# Patient Record
Sex: Male | Born: 1980 | Race: Black or African American | Hispanic: No | Marital: Single | Smoking: Never smoker
Health system: Southern US, Community
[De-identification: ages and names within clinical notes are randomized; demographics above are authoritative.]

---

## 2012-05-22 ENCOUNTER — Emergency Department (HOSPITAL_COMMUNITY)
Admission: EM | Admit: 2012-05-22 | Discharge: 2012-05-22 | Disposition: A | Discharge status: Home / Self Care | Payer: Self-pay | Attending: Emergency Medicine | Admitting: Emergency Medicine

## 2012-05-22 ENCOUNTER — Encounter (HOSPITAL_COMMUNITY): Payer: Self-pay | Admitting: *Deleted

## 2012-05-22 ENCOUNTER — Emergency Department (HOSPITAL_COMMUNITY): Payer: Self-pay

## 2012-05-22 DIAGNOSIS — R1110 Vomiting, unspecified: Secondary | ICD-10-CM | POA: Insufficient documentation

## 2012-05-22 DIAGNOSIS — R059 Cough, unspecified: Secondary | ICD-10-CM | POA: Insufficient documentation

## 2012-05-22 DIAGNOSIS — R5381 Other malaise: Secondary | ICD-10-CM | POA: Insufficient documentation

## 2012-05-22 DIAGNOSIS — J029 Acute pharyngitis, unspecified: Secondary | ICD-10-CM | POA: Insufficient documentation

## 2012-05-22 DIAGNOSIS — J111 Influenza due to unidentified influenza virus with other respiratory manifestations: Secondary | ICD-10-CM | POA: Insufficient documentation

## 2012-05-22 DIAGNOSIS — IMO0001 Reserved for inherently not codable concepts without codable children: Secondary | ICD-10-CM | POA: Insufficient documentation

## 2012-05-22 DIAGNOSIS — R05 Cough: Secondary | ICD-10-CM | POA: Insufficient documentation

## 2012-05-22 DIAGNOSIS — R5383 Other fatigue: Secondary | ICD-10-CM | POA: Insufficient documentation

## 2012-05-22 MED ORDER — IBUPROFEN 800 MG PO TABS
800.0000 mg | ORAL_TABLET | Freq: Once | ORAL | Status: AC
  Administered 2012-05-22: 800 mg via ORAL
  Filled 2012-05-22: qty 1

## 2012-05-22 MED ORDER — IBUPROFEN 800 MG PO TABS: 800.0000 mg | ORAL_TABLET | Freq: Four times a day (QID) | ORAL | Status: DC | PRN

## 2012-05-22 NOTE — ED Notes (Signed)
Pt c/o vomiting; body aches; pain; sleeping x 2 days; chills

## 2012-05-22 NOTE — ED Provider Notes (Signed)
History     CSN: [id number]  Arrival date & time 05/22/12  0220   First MD Initiated Contact with Patient 05/22/12 [phone]      No chief complaint on file.   (Consider location/radiation/quality/duration/timing/severity/associated sxs/prior treatment) HPI Comments: Patient with myalgias, fever cough sore throat post tussive vomiting for the past 2 days has taken Ibuprofen X1 and Robitussin and has been sleeping a lot   Did not get Flu immunization this year   The history is provided by the patient.    History reviewed. No pertinent past medical history.  History reviewed. No pertinent past surgical history.  No family history on file.  History  Substance Use Topics  . Smoking status: Never Smoker   . Smokeless tobacco: Not on file  . Alcohol Use: No      Review of Systems  Constitutional: Positive for fever, chills and fatigue.  HENT: Positive for sore throat. Negative for congestion.   Respiratory: Positive for cough. Negative for shortness of breath.   Cardiovascular: Negative for chest pain.  Gastrointestinal: Positive for vomiting.  Musculoskeletal: Positive for myalgias.  Skin: Negative for rash and wound.  Neurological: Positive for weakness. Negative for dizziness and headaches.    Allergies  Review of patient's allergies indicates no known allergies.  Home Medications   Current Outpatient Rx  Name  Route  Sig  Dispense  Refill  . GUAIFENESIN 100 MG/5ML PO SYRP   Oral   Take 200 mg by mouth 3 (three) times daily as needed. For cough         . IBUPROFEN 200 MG PO TABS   Oral   Take 400 mg by mouth every 6 (six) hours as needed. For pain         . ZINC PO   Oral   Take 1 tablet by mouth daily.         . IBUPROFEN 800 MG PO TABS   Oral   Take 1 tablet (800 mg total) by mouth every 6 (six) hours as needed for pain.   30 tablet   0     BP 129/80  Pulse 95  Temp 100.8 F (38.2 C)  Resp 28  SpO2 100%  Physical Exam  Constitutional: He  is oriented to person, place, and time. He appears well-developed and well-nourished. No distress.  HENT:  Head: Normocephalic.  Mouth/Throat: Oropharynx is clear and moist. No oropharyngeal exudate.  Eyes: Pupils are equal, round, and reactive to light.  Neck: Normal range of motion.  Cardiovascular: Normal rate and regular rhythm.   Pulmonary/Chest: Effort normal.  Abdominal: Soft.  Musculoskeletal: Normal range of motion.  Neurological: He is alert and oriented to person, place, and time.  Skin: Skin is warm. No rash noted. No pallor.    ED Course  Procedures (including critical care time)  Labs Reviewed - No data to display Dg Chest 2 View  05/22/2012  *RADIOLOGY REPORT*  Clinical Data: Fever, cough and tachycardia.  CHEST - 2 VIEW  Comparison: None.  Findings: The lungs are well-aerated and appear grossly clear. There is no evidence of focal consolidation, pleural effusion or pneumothorax.  The heart is normal in size; the mediastinal contour is within normal limits.  No acute osseous abnormalities are seen.  IMPRESSION: No acute cardiopulmonary process seen.   Original Report Authenticated By: Tonia Ghent, M.D.      1. Flu syndrome       MDM   Will obtain chest xary due to RR  of 26       Arman Filter, NP 05/22/12 0341  Arman Filter, NP 05/22/12 0341  Arman Filter, NP 05/22/12 0349  Arman Filter, NP 05/22/12 [phone]

## 2012-05-22 NOTE — ED Provider Notes (Signed)
Medical screening examination/treatment/procedure(s) were performed by non-physician practitioner and as supervising physician I was immediately available for consultation/collaboration.  John-Adam Keian Odriscoll, M.D.     John-Adam Treazure Nery, MD 05/22/12 0729 

## 2016-09-28 ENCOUNTER — Encounter (HOSPITAL_COMMUNITY): Payer: Self-pay

## 2016-09-28 ENCOUNTER — Emergency Department (HOSPITAL_COMMUNITY): Payer: Managed Care, Other (non HMO)

## 2016-09-28 ENCOUNTER — Emergency Department (HOSPITAL_COMMUNITY)
Admission: EM | Admit: 2016-09-28 | Discharge: 2016-09-29 | Disposition: A | Discharge status: Home / Self Care | Payer: Managed Care, Other (non HMO) | Attending: Emergency Medicine | Admitting: Emergency Medicine

## 2016-09-28 DIAGNOSIS — M62838 Other muscle spasm: Secondary | ICD-10-CM | POA: Diagnosis not present

## 2016-09-28 DIAGNOSIS — M25511 Pain in right shoulder: Secondary | ICD-10-CM

## 2016-09-28 NOTE — ED Triage Notes (Signed)
Pt complaining of R shoulder pain. Pt denies any injury/trauma. Pt states increased pain with motion. Pt with decreased ROM due to pain. Pt denies any numbness/tingling.

## 2016-09-29 MED ORDER — CYCLOBENZAPRINE HCL 10 MG PO TABS: 10.0000 mg | ORAL_TABLET | Freq: Two times a day (BID) | ORAL | 0 refills | Status: DC | PRN

## 2016-09-29 MED ORDER — DICLOFENAC SODIUM 50 MG PO TBEC: 50.0000 mg | DELAYED_RELEASE_TABLET | Freq: Two times a day (BID) | ORAL | 0 refills | Status: DC

## 2016-09-29 NOTE — ED Provider Notes (Signed)
[facility] Provider Note   CSN: [id number] Arrival date & time: 09/28/16  2200     History   Chief Complaint Chief Complaint  Patient presents with  . Shoulder Pain    HPI Edward Montes is a 36 y.o. male who presents to the ED with right shoulder pain that started 2 days ago when he woke. He does not remember any injury. He works at [employer] and does a lot of lifting.  . The history is provided by the patient. No language interpreter was used.  Shoulder Pain   This is a new problem. The current episode started 2 days ago. The problem occurs constantly. The problem has not changed since onset.The pain is present in the right shoulder. The quality of the pain is described as aching. The pain is moderate. Pertinent negatives include no numbness and no tingling. The symptoms are aggravated by activity. He has tried OTC pain medications for the symptoms. The treatment provided no relief. There has been no history of extremity trauma.    History reviewed. No pertinent past medical history.  There are no active problems to display for this patient.   History reviewed. No pertinent surgical history.     Home Medications    Prior to Admission medications   Medication Sig Start Date End Date Taking? Authorizing Provider  cyclobenzaprine (FLEXERIL) 10 MG tablet Take 1 tablet (10 mg total) by mouth 2 (two) times daily as needed for muscle spasms. 09/29/16   Janne NapoleonNeese, Aariyana Manz M, NP  diclofenac (VOLTAREN) 50 MG EC tablet Take 1 tablet (50 mg total) by mouth 2 (two) times daily. 09/29/16   Janne NapoleonNeese, Aran Menning M, NP  guaifenesin (ROBITUSSIN) 100 MG/5ML syrup Take 200 mg by mouth 3 (three) times daily as needed. For cough    [provider]  Multiple Vitamins-Minerals (ZINC PO) Take 1 tablet by mouth daily.    [provider]    Family History History reviewed. No pertinent family history.  Social History Social History  Substance Use Topics  . Smoking status: Never  Smoker  . Smokeless tobacco: Never Used  . Alcohol use No     Allergies   Patient has no known allergies.   Review of Systems Review of Systems  Constitutional: Negative for chills and fever.  HENT: Negative.   Respiratory: Negative for chest tightness.   Cardiovascular: Negative for chest pain.  Gastrointestinal: Negative for abdominal pain, nausea and vomiting.  Musculoskeletal: Positive for arthralgias. Negative for gait problem and neck pain.       Right shoulder pain  Skin: Negative for rash.  Neurological: Negative for tingling and numbness.  Psychiatric/Behavioral: The patient is not nervous/anxious.      Physical Exam Updated Vital Signs BP 127/88 (BP Location: Left Arm)   Pulse 68   Temp 98.5 F (36.9 C) (Oral)   Resp 14   SpO2 100%   Physical Exam  Constitutional: He is oriented to person, place, and time. He appears well-developed and well-nourished. No distress.  HENT:  Head: Normocephalic and atraumatic.  Eyes: EOM are normal.  Neck: Normal range of motion. Neck supple.  Cardiovascular: Normal rate and regular rhythm.   Pulmonary/Chest: Effort normal and breath sounds normal.  Musculoskeletal:       Right shoulder: He exhibits tenderness and spasm. He exhibits no crepitus, no deformity, no laceration, normal pulse and normal strength. Decreased range of motion: due to pain.  Pain to the posterior aspect of the right shoulder with  range of motion and palpation. Full range of motion of the wrist and elbow without pain.  Radial pulses 2+, adequate circulation, equal grips.   Neurological: He is alert and oriented to person, place, and time. No cranial nerve deficit.  Skin: Skin is warm and dry.  Psychiatric: He has a normal mood and affect.  Nursing note and vitals reviewed.    ED Treatments / Results  Labs (all labs ordered are listed, but only abnormal results are displayed) Labs Reviewed - No data to display  Radiology Dg Shoulder Right  Result  Date: 09/28/2016 CLINICAL DATA:  Right shoulder pain for 2 days EXAM: RIGHT SHOULDER - 2+ VIEW COMPARISON:  None. FINDINGS: No acute bony abnormality. Specifically, no fracture, subluxation, or dislocation. Soft tissues are intact. IMPRESSION: Negative. Electronically Signed   By: Charlett Nose M.D.   On: 09/28/2016 22:41    Procedures Procedures (including critical care time)  Medications Ordered in ED Medications - No data to display   Initial Impression / Assessment and Plan / ED Course  I have reviewed the triage vital signs and the nursing notes.  Pertinent imaging results that were available during my care of the patient were reviewed by me and considered in my medical decision making (see chart for details).   Final Clinical Impressions(s) / ED Diagnoses  36 y.o. male with right shoulder pain stable for d/c without focal neuro deficits and no fracture or dislocation noted on x-rays. Will treat for pain and muscle spasm and patient to f/u with ortho.  Final diagnoses:  Acute pain of right shoulder  Muscle spasm    New Prescriptions New Prescriptions   CYCLOBENZAPRINE (FLEXERIL) 10 MG TABLET    Take 1 tablet (10 mg total) by mouth 2 (two) times daily as needed for muscle spasms.   DICLOFENAC (VOLTAREN) 50 MG EC TABLET    Take 1 tablet (50 mg total) by mouth 2 (two) times daily.     Kerrie Buffalo Lowden, NP 09/29/16 4098    Derwood Kaplan, MD 09/29/16 1753

## 2016-09-29 NOTE — Discharge Instructions (Signed)
Do not drive while taking the muscle relaxant as it will  make you sleepy. Follow up with Dr. Charlann Boxerlin for further evaluation of your shoulder pain.

## 2018-01-05 ENCOUNTER — Encounter (HOSPITAL_COMMUNITY): Payer: Self-pay | Admitting: Emergency Medicine

## 2018-01-05 ENCOUNTER — Other Ambulatory Visit: Payer: Self-pay

## 2018-01-05 ENCOUNTER — Ambulatory Visit (HOSPITAL_COMMUNITY)
Admission: EM | Admit: 2018-01-05 | Discharge: 2018-01-05 | Disposition: A | Discharge status: Home / Self Care | Payer: Managed Care, Other (non HMO) | Attending: Family Medicine | Admitting: Family Medicine

## 2018-01-05 DIAGNOSIS — T148XXA Other injury of unspecified body region, initial encounter: Secondary | ICD-10-CM

## 2018-01-05 MED ORDER — IBUPROFEN 800 MG PO TABS: 800.0000 mg | ORAL_TABLET | Freq: Three times a day (TID) | ORAL | 0 refills | Status: DC | PRN

## 2018-01-05 MED ORDER — CYCLOBENZAPRINE HCL 10 MG PO TABS: 10.0000 mg | ORAL_TABLET | Freq: Two times a day (BID) | ORAL | 0 refills | Status: DC | PRN

## 2018-01-05 NOTE — ED Provider Notes (Addendum)
[facility]    CSN: [id number] Arrival date & time: 01/05/18  1841     History   Chief Complaint Chief Complaint  Patient presents with  . Back Pain    HPI Edward Montes is a 37 y.o. male.   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  He does lifting all day long.  Is been working there about 4 weeks.  In the last couple of weeks he has had a lot of 12-hour shifts.  He worked a 12-hour shift yesterday.  He woke up the morning with pain around his right shoulder blade.  It hurts with deep breath.  Hurts with arm movement.  No trauma or injury.  He was doing his usual work activities.  Ever had pain in his thoracic spine previously.  She has had problems with that left shoulder previously and sometimes has trouble lifting it overhead.  This is not kept him from doing his full duty work. History reviewed. No pertinent past medical history.  There are no active problems to display for this patient.   History reviewed. No pertinent surgical history.     Home Medications    Prior to Admission medications   Medication Sig Start Date End Date Taking? Authorizing Provider  cyclobenzaprine (FLEXERIL) 10 MG tablet Take 1 tablet (10 mg total) by mouth 2 (two) times daily as needed for muscle spasms. 01/05/18   Eustace Moore, MD  ibuprofen (ADVIL,MOTRIN) 800 MG tablet Take 1 tablet (800 mg total) by mouth every 8 (eight) hours as needed for moderate pain. 01/05/18   Eustace Moore, MD    Family History Family History  Problem Relation Age of Onset  . Healthy Mother     Social History Social History   Tobacco Use  . Smoking status: Never Smoker  . Smokeless tobacco: Never Used  Substance Use Topics  . Alcohol use: No  . Drug use: No     Allergies   Patient has no known allergies.   Review of Systems Review of Systems  Constitutional: Negative for chills and fever.  HENT: Negative for ear pain and sore throat.   Eyes:  Negative for pain and visual disturbance.  Respiratory: Negative for cough and shortness of breath.   Cardiovascular: Negative for chest pain and palpitations.  Gastrointestinal: Negative for abdominal pain and vomiting.  Genitourinary: Negative for dysuria and hematuria.  Musculoskeletal: Positive for back pain. Negative for arthralgias.       Thoracic  Skin: Negative for color change and rash.  Neurological: Negative for seizures and syncope.  All other systems reviewed and are negative.    Physical Exam Triage Vital Signs ED Triage Vitals  Enc Vitals Group     BP 01/05/18 1945 104/75     Pulse Rate 01/05/18 1945 84     Resp --      Temp 01/05/18 1945 98.1 F (36.7 C)     Temp Source 01/05/18 1945 Oral     SpO2 01/05/18 1945 100 %     Weight --      Height --      Head Circumference --      Peak Flow --      Pain Score 01/05/18 1941 7     Pain Loc --      Pain Edu? --      Excl. in GC? --    No data found.  Updated Vital Signs BP 104/75 (BP  Location: Right Arm)   Pulse 84   Temp 98.1 F (36.7 C) (Oral)   SpO2 100%   Visual Acuity Right Eye Distance:   Left Eye Distance:   Bilateral Distance:    Right Eye Near:   Left Eye Near:    Bilateral Near:     Physical Exam  Constitutional: He appears well-developed and well-nourished. No distress.  HENT:  Head: Normocephalic and atraumatic.  Mouth/Throat: Oropharynx is clear and moist.  Eyes: Pupils are equal, round, and reactive to light. Conjunctivae are normal.  Neck: Normal range of motion.  Cardiovascular: Normal rate, regular rhythm and normal heart sounds.  Pulmonary/Chest: Effort normal and breath sounds normal. No respiratory distress.  Abdominal: Soft. He exhibits no distension.  Musculoskeletal: Normal range of motion. He exhibits no edema.       Cervical back: He exhibits tenderness.       Back:  Neurological: He is alert.  Skin: Skin is warm and dry.     UC Treatments / Results  Labs (all  labs ordered are listed, but only abnormal results are displayed) Labs Reviewed - No data to display  EKG None  Radiology No results found.  Procedures Procedures (including critical care time)  Medications Ordered in UC Medications - No data to display  Initial Impression / Assessment and Plan / UC Course  I have reviewed the triage vital signs and the nursing notes.  Pertinent labs & imaging results that were available during my care of the patient were reviewed by me and considered in my medical decision making (see chart for details).      Final Clinical Impressions(s) / UC Diagnoses   Final diagnoses:  Muscle strain     Discharge Instructions     Rest Ice to area Take the ibuprofen 3 x a day Take the cyclobenzaprine as needed - muscle relaxer Follow up with ortho if not better by end of week   ED Prescriptions    Medication Sig Dispense Auth. Provider   cyclobenzaprine (FLEXERIL) 10 MG tablet Take 1 tablet (10 mg total) by mouth 2 (two) times daily as needed for muscle spasms. 30 tablet Eustace Moore, MD   ibuprofen (ADVIL,MOTRIN) 800 MG tablet Take 1 tablet (800 mg total) by mouth every 8 (eight) hours as needed for moderate pain. 90 tablet Eustace Moore, MD     Controlled Substance Prescriptions [place] Controlled Substance Registry consulted? Not Applicable   Eustace Moore, MD 01/05/18 2116    Eustace Moore, MD 01/05/18 2117

## 2018-01-05 NOTE — ED Triage Notes (Signed)
Left shoulder blade and left shoulder pain, spasms.  Onset of pain this morning.  No known injury.  Patient has been working a new job at a distribution center.  Patient is right handed.

## 2018-01-05 NOTE — Discharge Instructions (Signed)
Rest Ice to area Take the ibuprofen 3 x a day Take the cyclobenzaprine as needed - muscle relaxer Follow up with ortho if not better by end of week

## 2018-02-15 ENCOUNTER — Ambulatory Visit (HOSPITAL_COMMUNITY)
Admission: EM | Admit: 2018-02-15 | Discharge: 2018-02-15 | Disposition: A | Discharge status: Home / Self Care | Payer: Managed Care, Other (non HMO) | Attending: Internal Medicine | Admitting: Internal Medicine

## 2018-02-15 ENCOUNTER — Ambulatory Visit (INDEPENDENT_AMBULATORY_CARE_PROVIDER_SITE_OTHER): Payer: Managed Care, Other (non HMO)

## 2018-02-15 ENCOUNTER — Other Ambulatory Visit: Payer: Self-pay

## 2018-02-15 ENCOUNTER — Encounter (HOSPITAL_COMMUNITY): Payer: Self-pay | Admitting: Emergency Medicine

## 2018-02-15 DIAGNOSIS — X503XXA Overexertion from repetitive movements, initial encounter: Secondary | ICD-10-CM | POA: Diagnosis not present

## 2018-02-15 DIAGNOSIS — S46912A Strain of unspecified muscle, fascia and tendon at shoulder and upper arm level, left arm, initial encounter: Secondary | ICD-10-CM

## 2018-02-15 DIAGNOSIS — R05 Cough: Secondary | ICD-10-CM | POA: Diagnosis not present

## 2018-02-15 MED ORDER — DICLOFENAC POTASSIUM(MIGRAINE) 50 MG PO PACK: PACK | ORAL | 0 refills | Status: DC

## 2018-02-15 NOTE — ED Provider Notes (Signed)
[facility]    CSN: [id number] Arrival date & time: 02/15/18  1650     History   Chief Complaint Chief Complaint  Patient presents with  . Shoulder Pain    HPI Edward Montes is a 37 y.o. male.   Woke up this am at 5 with L shoulder pain. Denies an active injury that her recalls. He does do a lot of repetitive lifting at work. His pain is in the axilla, and lifting more than 90 degrees. Pain is bette with keeping arm down and not move it,but very minimal. Pain level at its worse 7.5/10, at rest 5/10. Denies neck pain or L arm paresthesia. He took Advil 400 mg today which does not feel it helped much. He did have L posterior shoulder pain last month and was seen here. Did not have an xray. Today's  this pain is different than then.      History reviewed. No pertinent past medical history.  There are no active problems to display for this patient.   History reviewed. No pertinent surgical history.     Home Medications    Prior to Admission medications   Medication Sig Start Date End Date Taking? Authorizing Provider  ibuprofen (ADVIL,MOTRIN) 800 MG tablet Take 1 tablet (800 mg total) by mouth every 8 (eight) hours as needed for moderate pain. 01/05/18  Yes Eustace Moore, MD  Diclofenac Potassium 50 MG PACK One tid prn pain 02/15/18   Rodriguez-Southworth, Nettie Elm, PA-C    Family History Family History  Problem Relation Age of Onset  . Healthy Mother     Social History Social History   Tobacco Use  . Smoking status: Never Smoker  . Smokeless tobacco: Never Used  Substance Use Topics  . Alcohol use: No  . Drug use: No     Allergies   Patient has no known allergies.   Review of Systems Review of Systems  Constitutional: Negative for chills, diaphoresis and fever.  HENT: Positive for postnasal drip and rhinorrhea.        Has allergies  Respiratory: Positive for cough. Negative for chest tightness and shortness of breath.        Has cough  from allergies, but is not worse than usual.   Cardiovascular: Negative for chest pain.  Gastrointestinal: Negative for abdominal pain.  Musculoskeletal: Positive for arthralgias. Negative for joint swelling, neck pain and neck stiffness.  Skin: Negative for rash.  Neurological: Negative for weakness and numbness.  Hematological: Negative for adenopathy.     Physical Exam Triage Vital Signs ED Triage Vitals  Enc Vitals Group     BP 02/15/18 1724 110/63     Pulse Rate 02/15/18 1724 60     Resp 02/15/18 1724 16     Temp 02/15/18 1724 98.2 F (36.8 C)     Temp Source 02/15/18 1724 Oral     SpO2 02/15/18 1724 100 %     Weight --      Height --      Head Circumference --      Peak Flow --      Pain Score 02/15/18 1722 7     Pain Loc --      Pain Edu? --      Excl. in GC? --    No data found.  Updated Vital Signs BP 110/63 (BP Location: Left Arm)   Pulse 60   Temp 98.2 F (36.8 C) (Oral)   Resp 16   SpO2 100%  Visual Acuity Right Eye Distance:   Left Eye Distance:   Bilateral Distance:    Right Eye Near:   Left Eye Near:    Bilateral Near:     Physical Exam  Constitutional: He is oriented to person, place, and time. He appears well-developed and well-nourished. No distress.  HENT:  Head: Normocephalic and atraumatic.  Right Ear: External ear normal.  Nose: Nose normal.  Eyes: Conjunctivae are normal. Right eye exhibits no discharge. Left eye exhibits no discharge. No scleral icterus.  Neck: Neck supple.  Cardiovascular: Normal rate and regular rhythm.  Pulmonary/Chest: Effort normal and breath sounds normal.  Musculoskeletal: He exhibits no deformity.  + impingement test. He does not have any pain with local palpation of his L shoulder. Clavicles are symmetric and non tender.  L shoulder strength 5/5, but felt pain on anterior shoulder when this was being tested. Does not have any tenderness on L trapezius or scapula region.   Neurological: He is alert and  oriented to person, place, and time. He displays normal reflexes. No sensory deficit.  Skin: Skin is warm and dry. Capillary refill takes less than 2 seconds. He is not diaphoretic.  Psychiatric: He has a normal mood and affect. His behavior is normal. Judgment and thought content normal.  Nursing note and vitals reviewed.  UC Treatments / Results  Labs (all labs ordered are listed, but only abnormal results are displayed) Labs Reviewed - No data to display  EKG None  Radiology Preliminary independent review of L shoulder xray is neg. Radiology final read is pending.   Procedures   Medications Ordered in UC Medications - No data to display  Initial Impression / Assessment and Plan / UC Course  I have reviewed the triage vital signs and the nursing notes.  Pertinent  imaging results that were available during my care of the patient were reviewed by me and considered in my medical decision making (see chart for details).  Pt was placed on diclofenac as noted and taught to do stretching exercises and walking the wall exercises.  Needs to Fu with ortho. I also gave him FNP information who is taking new pts    Final Clinical Impressions(s) / UC Diagnoses   Final diagnoses:  Strain of left shoulder, initial encounter     Discharge Instructions     1- Do the walking on the wall exercises I showed you to prevent shoulder freezing 2- Ice area of pain for 15-20 minutes at a time 2-4 times a day for 48h. 3- Take the prescription for pain and inflammation I sent to your pharmacy as indicated and if you need more pain relief, you may add Tylenol 500 mg 2 every 6 h.  4- Go see your family Dr for follow up, you may need to be sent to physical therapy to help.     ED Prescriptions    Medication Sig Dispense Auth. Provider   Diclofenac Potassium 50 MG PACK One tid prn pain 30 each Rodriguez-Southworth, Nettie Elm, PA-C     Controlled Substance Prescriptions [place] Controlled Substance  Registry consulted? no   Garey Ham, [place] 02/15/18 1855

## 2018-02-15 NOTE — ED Triage Notes (Signed)
The patient presented to the [facility] with a complaint of left shoulder pain that started today. The patient denied any known injury but stated it could be work related.

## 2018-02-15 NOTE — Discharge Instructions (Signed)
1- Do the walking on the wall exercises I showed you to prevent shoulder freezing 2- Ice area of pain for 15-20 minutes at a time 2-4 times a day for 48h. 3- Take the prescription for pain and inflammation I sent to your pharmacy as indicated and if you need more pain relief, you may add Tylenol 500 mg 2 every 6 h.  4- Go see your family Dr for follow up, you may need to be sent to physical therapy to help.

## 2018-02-24 ENCOUNTER — Ambulatory Visit (INDEPENDENT_AMBULATORY_CARE_PROVIDER_SITE_OTHER): Payer: Managed Care, Other (non HMO)

## 2018-02-24 ENCOUNTER — Ambulatory Visit (INDEPENDENT_AMBULATORY_CARE_PROVIDER_SITE_OTHER): Payer: Managed Care, Other (non HMO) | Admitting: Family Medicine

## 2018-02-24 ENCOUNTER — Encounter: Payer: Self-pay | Admitting: Family Medicine

## 2018-02-24 VITALS — BP 110/62 | HR 55 | Temp 98.4°F

## 2018-02-24 DIAGNOSIS — M25512 Pain in left shoulder: Secondary | ICD-10-CM | POA: Insufficient documentation

## 2018-02-24 MED ORDER — IBUPROFEN-FAMOTIDINE 800-26.6 MG PO TABS: 1.0000 | ORAL_TABLET | Freq: Three times a day (TID) | ORAL | 3 refills | Status: DC

## 2018-02-24 NOTE — Progress Notes (Signed)
Edward Montes - 37 y.o. male MRN [id number]  Date of birth: Nov 26, 1980  SUBJECTIVE:  Including CC & ROS.  Chief Complaint  Patient presents with  . Shoulder Pain    L shoulder pain x2 months, aggrevated due to reaching and lifting. taking OTC for relief    Edward Montes is a 37 y.o. male that is presenting with left shoulder pain.  The pain is localized to the shoulder.  The pain is worse with reaching out or overhead lifting or his arm in certain positions.  The pain is moderate in severity.  He denies any inciting event.  Pain is sharp in nature.  Denies any radicular symptoms.  Denies any sensational changes.  Denies any bruising or swelling.  Pain is located to the lateral aspect of the shoulder.   Review of Systems  Constitutional: Negative for fever.  HENT: Negative for congestion.   Respiratory: Negative for cough.   Cardiovascular: Negative for chest pain.  Gastrointestinal: Negative for abdominal pain.  Musculoskeletal: Negative for back pain.  Skin: Negative for color change.  Neurological: Negative for weakness.  Hematological: Negative for adenopathy.  Psychiatric/Behavioral: Negative for agitation.    HISTORY: Past Medical, Surgical, Social, and Family History Reviewed & Updated per EMR.   Pertinent Historical Findings include:  No past medical history on file.  No past surgical history on file.  No Known Allergies  Family History  Problem Relation Age of Onset  . Healthy Mother      Social History   Socioeconomic History  . Marital status: Single    Spouse name: Not on file  . Number of children: Not on file  . Years of education: Not on file  . Highest education level: Not on file  Occupational History  . Not on file  Social Needs  . Financial resource strain: Not on file  . Food insecurity:    Worry: Not on file    Inability: Not on file  . Transportation needs:    Medical: Not on file    Non-medical: Not on file  Tobacco Use  . Smoking  status: Never Smoker  . Smokeless tobacco: Never Used  Substance and Sexual Activity  . Alcohol use: No  . Drug use: No  . Sexual activity: Not on file  Lifestyle  . Physical activity:    Days per week: Not on file    Minutes per session: Not on file  . Stress: Not on file  Relationships  . Social connections:    Talks on phone: Not on file    Gets together: Not on file    Attends religious service: Not on file    Active member of club or organization: Not on file    Attends meetings of clubs or organizations: Not on file    Relationship status: Not on file  . Intimate partner violence:    Fear of current or ex partner: Not on file    Emotionally abused: Not on file    Physically abused: Not on file    Forced sexual activity: Not on file  Other Topics Concern  . Not on file  Social History Narrative  . Not on file     PHYSICAL EXAM:  VS: BP 110/62 (BP Location: Right Arm, Patient Position: Sitting, Cuff Size: Normal)   Pulse (!) 55   Temp 98.4 F (36.9 C) (Oral)   SpO2 98%  Physical Exam Gen: NAD, alert, cooperative with exam, well-appearing ENT: normal lips, normal nasal  mucosa,  Eye: normal EOM, normal conjunctiva and lids CV:  no edema, +2 pedal pulses   Resp: no accessory muscle use, non-labored,   Skin: no rashes, no areas of induration  Neuro: normal tone, normal sensation to touch Psych:  normal insight, alert and oriented MSK:  Left shoulder: Normal active range of motion. Normal external rotation with abduction. Normal strength resistance with external rotation and internal rotation. Pain with empty can testing. Pain with speeds test. Pain with O'Brien's testing. Normal grip strength. Neurovascular intact  Limited ultrasound: Left shoulder:  Normal-appearing biceps tendon. Normal-appearing subscap and static dynamic testing Supraspinatus with hypoechoic changes to suggest tendinopathy.  Possible spurring at the tip of the acromium but no catching  or impinging upon dynamic testing the supraspinatus AC joint with mild degenerative changes and calcification  Summary: Findings suggestive of supraspinatus tendinopathy.  Ultrasound and interpretation by Clare Gandy, MD      ASSESSMENT & PLAN:   Acute pain of left shoulder Findings suggestive of supraspinatus tendinopathy.  Has repetitive lifting at work that seems to aggravate it.  No specific injury. -Counseled on supportive care and exercise -Duexis. -Provided Thera-Band. -If no improvement consider imaging, x-ray, or injection.

## 2018-02-24 NOTE — Patient Instructions (Signed)
Nice to meet you  Please try the medicine  Please try the exercises  Please see me back in 3 weeks.

## 2018-02-24 NOTE — Assessment & Plan Note (Signed)
Findings suggestive of supraspinatus tendinopathy.  Has repetitive lifting at work that seems to aggravate it.  No specific injury. -Counseled on supportive care and exercise -Duexis. -Provided Thera-Band. -If no improvement consider imaging, x-ray, or injection.

## 2018-03-17 ENCOUNTER — Ambulatory Visit: Payer: Managed Care, Other (non HMO) | Admitting: Family Medicine

## 2018-03-17 DIAGNOSIS — Z0289 Encounter for other administrative examinations: Secondary | ICD-10-CM

## 2018-03-17 NOTE — Progress Notes (Deleted)
  Edward Stabsntoine L Singleterry - 37 y.o. male MRN [id number]  Date of birth: 04-Sep-1980  SUBJECTIVE:  Including CC & ROS.  No chief complaint on file.   Edward Montes is a 37 y.o. male that is  ***.  ***   Review of Systems  HISTORY: Past Medical, Surgical, Social, and Family History Reviewed & Updated per EMR.   Pertinent Historical Findings include:  No past medical history on file.  No past surgical history on file.  No Known Allergies  Family History  Problem Relation Age of Onset  . Healthy Mother      Social History   Socioeconomic History  . Marital status: Single    Spouse name: Not on file  . Number of children: Not on file  . Years of education: Not on file  . Highest education level: Not on file  Occupational History  . Not on file  Social Needs  . Financial resource strain: Not on file  . Food insecurity:    Worry: Not on file    Inability: Not on file  . Transportation needs:    Medical: Not on file    Non-medical: Not on file  Tobacco Use  . Smoking status: Never Smoker  . Smokeless tobacco: Never Used  Substance and Sexual Activity  . Alcohol use: No  . Drug use: No  . Sexual activity: Not on file  Lifestyle  . Physical activity:    Days per week: Not on file    Minutes per session: Not on file  . Stress: Not on file  Relationships  . Social connections:    Talks on phone: Not on file    Gets together: Not on file    Attends religious service: Not on file    Active member of club or organization: Not on file    Attends meetings of clubs or organizations: Not on file    Relationship status: Not on file  . Intimate partner violence:    Fear of current or ex partner: Not on file    Emotionally abused: Not on file    Physically abused: Not on file    Forced sexual activity: Not on file  Other Topics Concern  . Not on file  Social History Narrative  . Not on file     PHYSICAL EXAM:  VS: There were no vitals taken for this visit. Physical  Exam Gen: NAD, alert, cooperative with exam, well-appearing ENT: normal lips, normal nasal mucosa,  Eye: normal EOM, normal conjunctiva and lids CV:  no edema, +2 pedal pulses   Resp: no accessory muscle use, non-labored,  GI: no masses or tenderness, no hernia  Skin: no rashes, no areas of induration  Neuro: normal tone, normal sensation to touch Psych:  normal insight, alert and oriented MSK:  ***      ASSESSMENT & PLAN:   No problem-specific Assessment & Plan notes found for this encounter.   The above documentation has been reviewed and is accurate and complete. Clare Gandy<Zema Lizardo, MD 03/17/2018, 8:19 AM>

## 2018-04-07 ENCOUNTER — Other Ambulatory Visit: Payer: Self-pay

## 2018-04-07 ENCOUNTER — Encounter (HOSPITAL_COMMUNITY): Payer: Self-pay | Admitting: Emergency Medicine

## 2018-04-07 ENCOUNTER — Emergency Department (HOSPITAL_COMMUNITY)
Admission: EM | Admit: 2018-04-07 | Discharge: 2018-04-07 | Disposition: A | Discharge status: Home / Self Care | Payer: Managed Care, Other (non HMO) | Attending: Emergency Medicine | Admitting: Emergency Medicine

## 2018-04-07 DIAGNOSIS — R1110 Vomiting, unspecified: Secondary | ICD-10-CM | POA: Diagnosis present

## 2018-04-07 DIAGNOSIS — R197 Diarrhea, unspecified: Secondary | ICD-10-CM | POA: Diagnosis not present

## 2018-04-07 DIAGNOSIS — R112 Nausea with vomiting, unspecified: Secondary | ICD-10-CM

## 2018-04-07 LAB — COMPREHENSIVE METABOLIC PANEL
ALBUMIN: 4 g/dL (ref 3.5–5.0)
ALT: 22 U/L (ref 0–44)
AST: 21 U/L (ref 15–41)
Alkaline Phosphatase: 104 U/L (ref 38–126)
Anion gap: 9 (ref 5–15)
BUN: 17 mg/dL (ref 6–20)
CHLORIDE: 106 mmol/L (ref 98–111)
CO2: 26 mmol/L (ref 22–32)
Calcium: 9.3 mg/dL (ref 8.9–10.3)
Creatinine, Ser: 1.04 mg/dL (ref 0.61–1.24)
GFR calc Af Amer: >60 mL/min (ref >60)
Glucose, Bld: 101 mg/dL — ABNORMAL HIGH (ref 70–99)
POTASSIUM: 3.6 mmol/L (ref 3.5–5.1)
Sodium: 141 mmol/L (ref 135–145)
Total Bilirubin: 0.4 mg/dL (ref 0.3–1.2)
Total Protein: 7.2 g/dL (ref 6.5–8.1)

## 2018-04-07 LAB — CBC
HEMATOCRIT: 45 % (ref 39.0–52.0)
HEMOGLOBIN: 14.1 g/dL (ref 13.0–17.0)
MCH: 26.7 pg (ref 26.0–34.0)
MCHC: 31.3 g/dL (ref 30.0–36.0)
MCV: 85.2 fL (ref 80.0–100.0)
NRBC: 0 % (ref 0.0–0.2)
Platelets: 304 10*3/uL (ref 150–400)
RBC: 5.28 MIL/uL (ref 4.22–5.81)
RDW: 13.1 % (ref 11.5–15.5)
WBC: 6.7 10*3/uL (ref 4.0–10.5)

## 2018-04-07 LAB — LIPASE, BLOOD: LIPASE: 31 U/L (ref 11–51)

## 2018-04-07 MED ORDER — ONDANSETRON 4 MG PO TBDP
8.0000 mg | ORAL_TABLET | Freq: Once | ORAL | Status: AC
  Administered 2018-04-07: 8 mg via ORAL
  Filled 2018-04-07: qty 2

## 2018-04-07 NOTE — ED Notes (Signed)
Patient given PO fluids. Tolerating well at this time.  

## 2018-04-07 NOTE — Discharge Instructions (Signed)

## 2018-04-07 NOTE — ED Triage Notes (Signed)
Pt reports generalized body aches, N/V/D that started this morning. Denies fevers.

## 2018-04-07 NOTE — ED Provider Notes (Signed)
[facility] EMERGENCY DEPARTMENT Provider Note   CSN: [id number] Arrival date & time: 04/07/18  0002     History   Chief Complaint Chief Complaint  Patient presents with  . Emesis  . Diarrhea  . Generalized Body Aches    HPI DEVAUN HERNANDEZ is a 37 y.o. male.  The history is provided by the patient.  Emesis   This is a new problem. The current episode started 12 to 24 hours ago. The problem has been gradually worsening. The emesis has an appearance of stomach contents. There has been no fever. Associated symptoms include chills, cough, diarrhea and myalgias. Pertinent negatives include no abdominal pain and no fever.  Diarrhea   Associated symptoms include vomiting, chills, myalgias and cough. Pertinent negatives include no abdominal pain.    Patient reports onset of vomiting/diarrhea the past 4 hours.  Stool and vomit are both nonbloody.  No fevers.  No travel.  He is otherwise healthy.  No significant abdominal pain  Patient Active Problem List   Diagnosis Date Noted  . Acute pain of left shoulder 02/24/2018    History reviewed. No pertinent surgical history.      Home Medications    Prior to Admission medications   Medication Sig Start Date End Date Taking? Authorizing Provider  Diclofenac Potassium 50 MG PACK One tid prn pain Patient not taking: Reported on 02/24/2018 02/15/18   Rodriguez-Southworth, Nettie Elm, PA-C  ibuprofen (ADVIL,MOTRIN) 800 MG tablet Take 1 tablet (800 mg total) by mouth every 8 (eight) hours as needed for moderate pain. 01/05/18   Eustace Moore, MD  Ibuprofen-Famotidine 800-26.6 MG TABS Take 1 tablet by mouth 3 (three) times daily. 02/24/18   Myra Rude, MD    Family History Family History  Problem Relation Age of Onset  . Healthy Mother     Social History Social History   Tobacco Use  . Smoking status: Never Smoker  . Smokeless tobacco: Never Used  Substance Use Topics  . Alcohol use: No  . Drug use:  No     Allergies   Patient has no known allergies.   Review of Systems Review of Systems  Constitutional: Positive for chills. Negative for fever.  Respiratory: Positive for cough.   Gastrointestinal: Positive for diarrhea and vomiting. Negative for abdominal pain.  Musculoskeletal: Positive for myalgias.  All other systems reviewed and are negative.    Physical Exam Updated Vital Signs BP 113/82 (BP Location: Right Arm)   Pulse 65   Temp 97.7 F (36.5 C) (Oral)   Resp 18   Ht 1.88 m (6\' 2" )   Wt 77.1 kg   SpO2 98%   BMI 21.83 kg/m   Physical Exam CONSTITUTIONAL: Well developed/well nourished HEAD: Normocephalic/atraumatic EYES: EOMI/PERRL, no icterus ENMT: Mucous membranes moist NECK: supple no meningeal signs SPINE/BACK:entire spine nontender CV: S1/S2 noted, no murmurs/rubs/gallops noted LUNGS: Lungs are clear to auscultation bilaterally, no apparent distress ABDOMEN: soft, nontender, no rebound or guarding, bowel sounds noted throughout abdomen NEURO: Pt is awake/alert/appropriate, moves all extremitiesx4.  No facial droop.   EXTREMITIES: pulses normal/equal, full ROM SKIN: warm, color normal PSYCH: no abnormalities of mood noted, alert and oriented to situation   ED Treatments / Results  Labs (all labs ordered are listed, but only abnormal results are displayed) Labs Reviewed  COMPREHENSIVE METABOLIC PANEL - Abnormal; Notable for the following components:      Result Value   Glucose, Bld 101 (*)    All other components  within normal limits  LIPASE, BLOOD  CBC    EKG None  Radiology No results found.  Procedures Procedures (including critical care time)  Medications Ordered in ED Medications  ondansetron (ZOFRAN-ODT) disintegrating tablet 8 mg (8 mg Oral Given 04/07/18 0412)     Initial Impression / Assessment and Plan / ED Course  I have reviewed the triage vital signs and the nursing notes.  Pertinent labs   results that were  available during my care of the patient were reviewed by me and considered in my medical decision making (see chart for details).     Patient with vomiting and diarrhea.  Vitals are appropriate.  He has no abdominal pain.  Labs reassuring Suspect viral illness.  He is appropriate for discharge home  Final Clinical Impressions(s) / ED Diagnoses   Final diagnoses:  Nausea vomiting and diarrhea    ED Discharge Orders    None       Zadie RhineWickline, Skyelynn Rambeau, MD 04/07/18 [id number]

## 2018-04-07 NOTE — ED Notes (Signed)
Patient verbalizes understanding of medications and discharge instructions. No further questions at this time. VSS and patient ambulatory at discharge.   

## 2018-09-08 IMAGING — DX DG SHOULDER 2+V*R*
2 series · 2 of 2 positions shown · non-contrast
Comparison: None.

CLINICAL DATA: Right shoulder pain for 2 days

EXAM:
RIGHT SHOULDER - 2+ VIEW

[shoulder grashey]
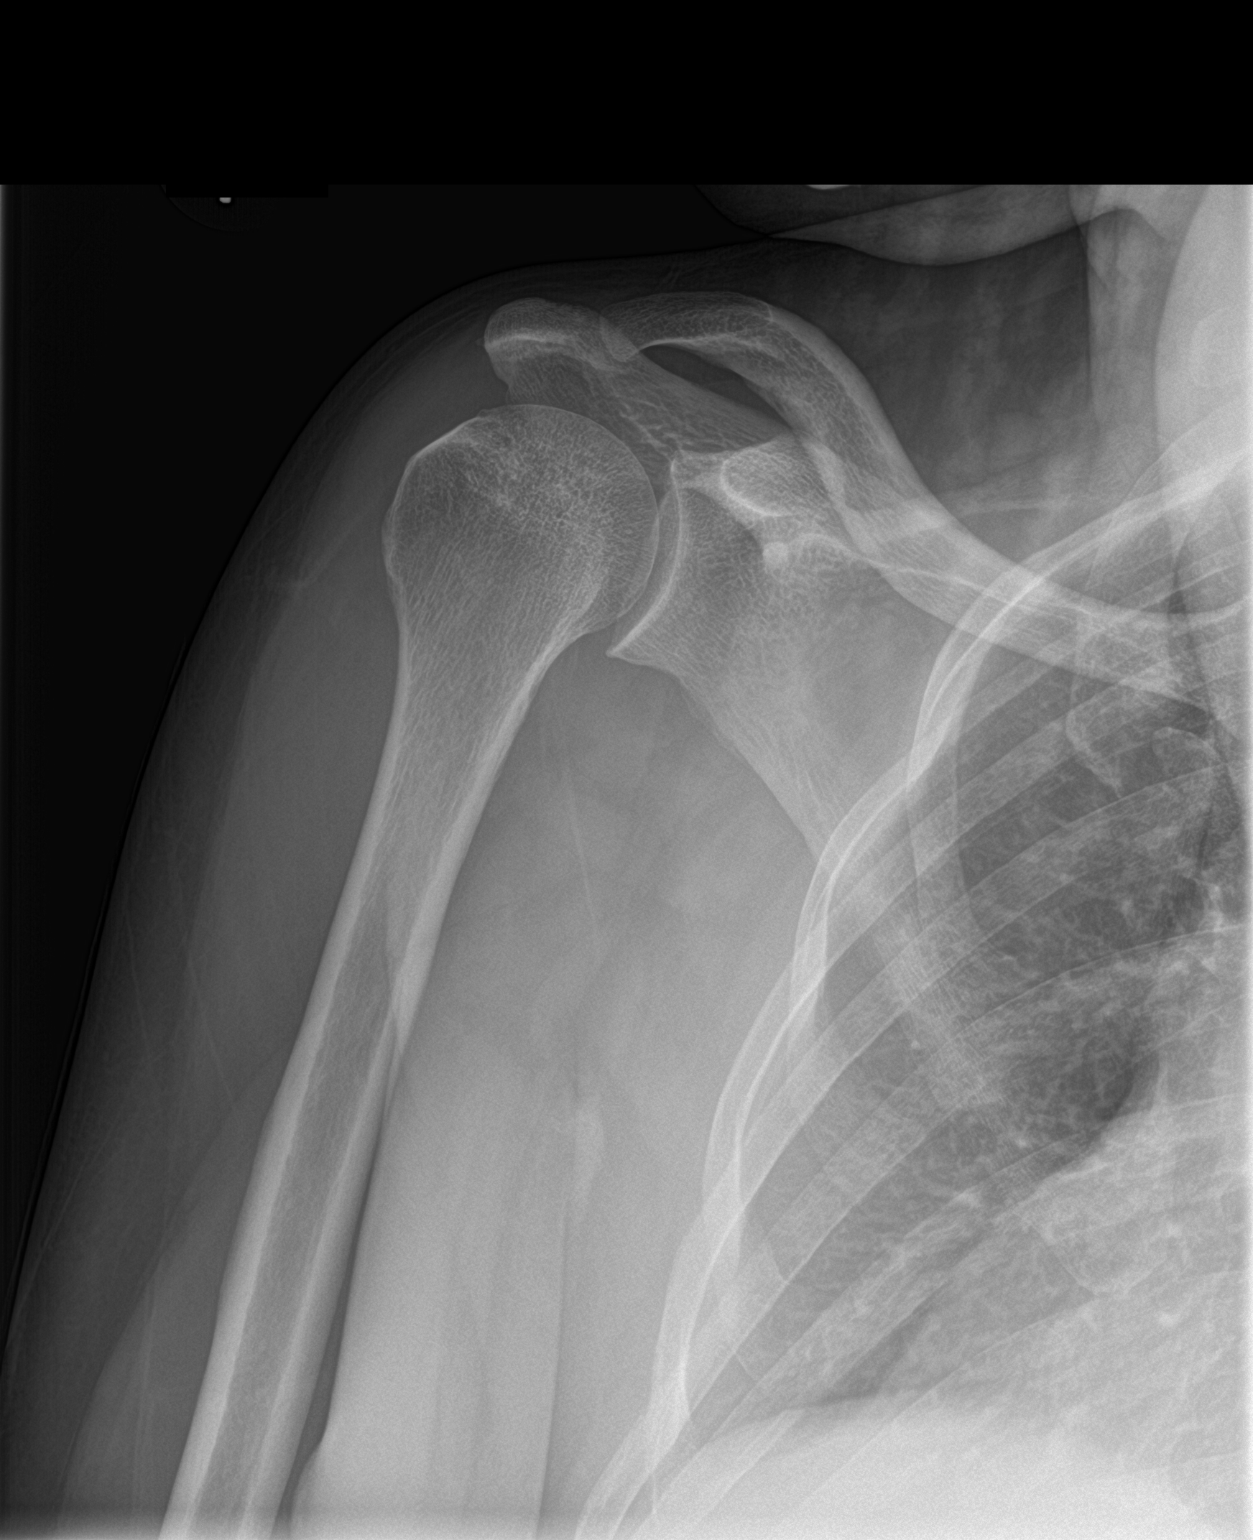

[shoulder y view]
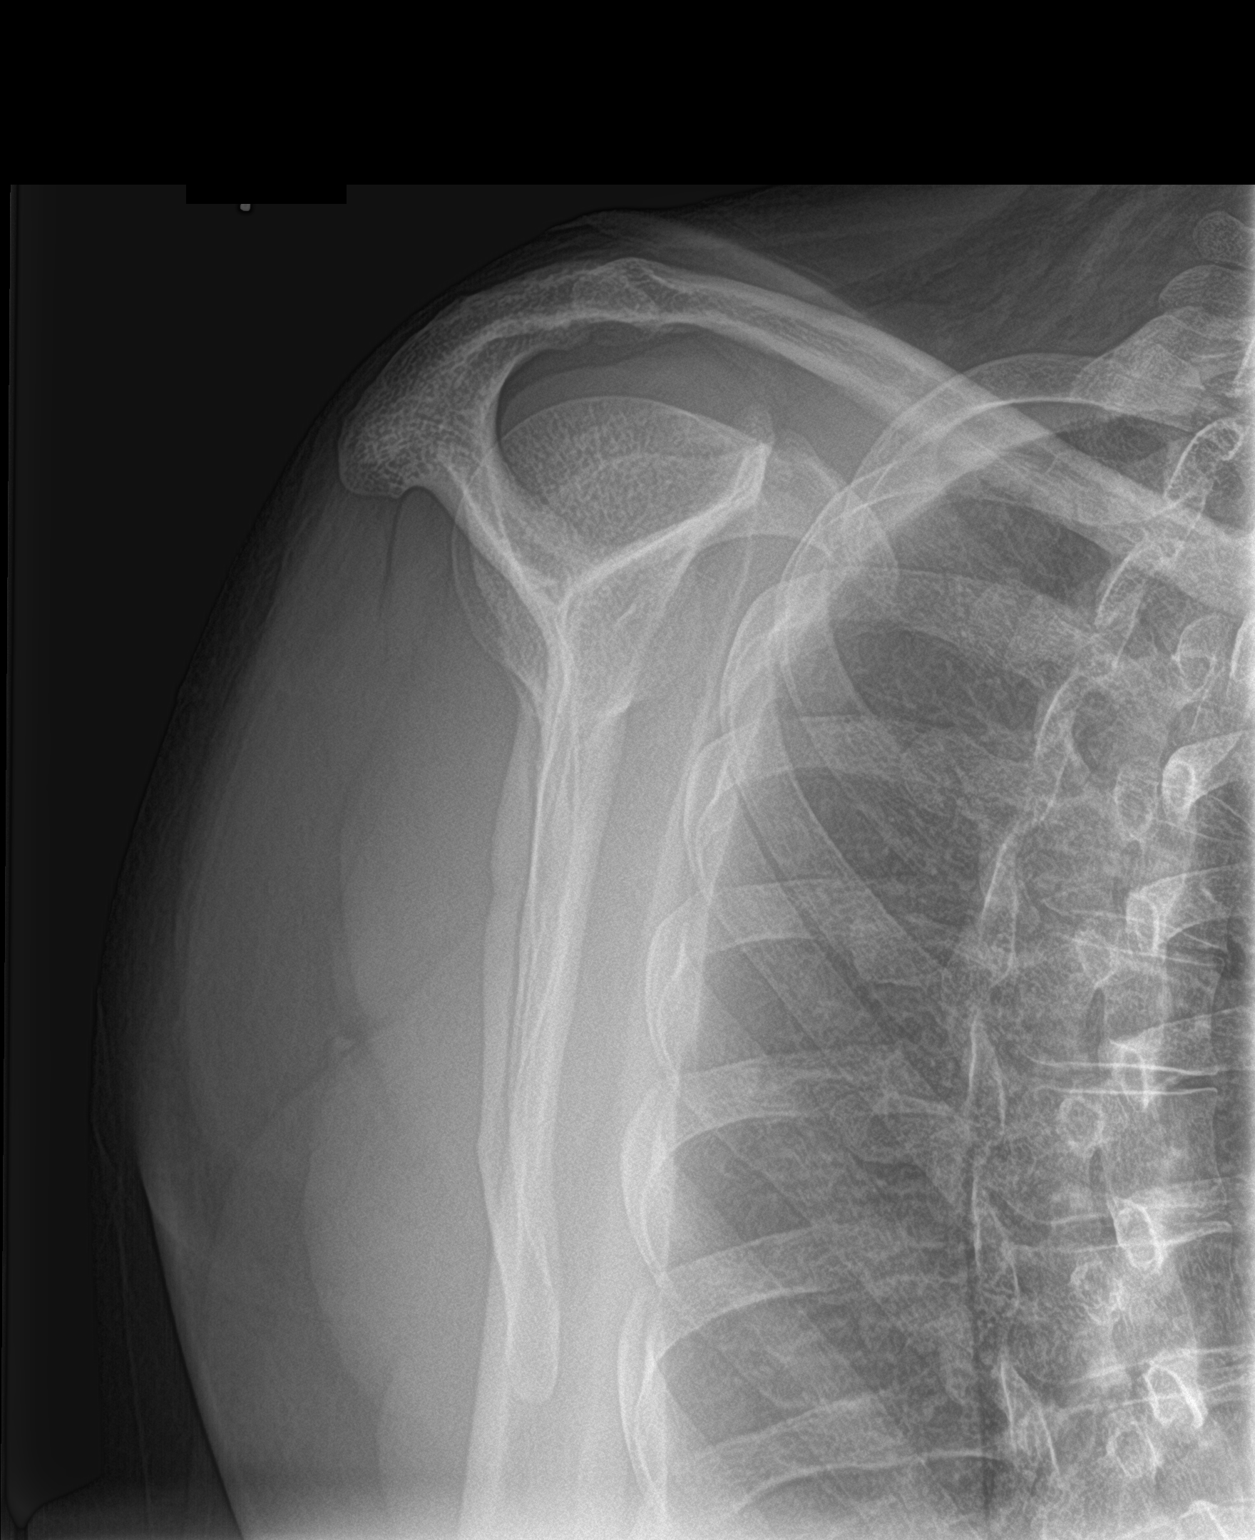

[2 of 2 positions shown; findings below may reference images not displayed]

FINDINGS: No acute bony abnormality. Specifically, no fracture, subluxation,
or dislocation. Soft tissues are intact.
IMPRESSION: Negative.

## 2019-02-15 ENCOUNTER — Encounter (HOSPITAL_COMMUNITY): Payer: Self-pay | Admitting: Emergency Medicine

## 2019-02-15 ENCOUNTER — Emergency Department (HOSPITAL_COMMUNITY)
Admission: EM | Admit: 2019-02-15 | Discharge: 2019-02-15 | Disposition: A | Discharge status: Home / Self Care | Payer: Managed Care, Other (non HMO) | Attending: Emergency Medicine | Admitting: Emergency Medicine

## 2019-02-15 ENCOUNTER — Other Ambulatory Visit: Payer: Self-pay

## 2019-02-15 DIAGNOSIS — M25532 Pain in left wrist: Secondary | ICD-10-CM | POA: Insufficient documentation

## 2019-02-15 DIAGNOSIS — M25531 Pain in right wrist: Secondary | ICD-10-CM | POA: Insufficient documentation

## 2019-02-15 MED ORDER — OXYCODONE-ACETAMINOPHEN 5-325 MG PO TABS
1.0000 | ORAL_TABLET | Freq: Once | ORAL | Status: AC
  Administered 2019-02-15: 1 via ORAL
  Filled 2019-02-15: qty 1
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
## 2019-04-04 ENCOUNTER — Other Ambulatory Visit: Payer: Self-pay

## 2019-04-04 ENCOUNTER — Encounter (HOSPITAL_COMMUNITY): Payer: Self-pay

## 2019-04-04 ENCOUNTER — Emergency Department (HOSPITAL_COMMUNITY)
Admission: EM | Admit: 2019-04-04 | Discharge: 2019-04-04 | Disposition: A | Discharge status: Home / Self Care | Payer: Managed Care, Other (non HMO) | Attending: Emergency Medicine | Admitting: Emergency Medicine

## 2019-04-04 DIAGNOSIS — H6123 Impacted cerumen, bilateral: Secondary | ICD-10-CM | POA: Insufficient documentation

## 2019-04-04 MED ORDER — ACETIC ACID 2 % OT SOLN: 4.0000 [drp] | Freq: Three times a day (TID) | OTIC | 0 refills | Status: DC

## 2019-04-04 NOTE — ED Triage Notes (Signed)
Pt presents w/Right head pain and ringing in his Right ear x1 week. No neuro deficits noted

## 2019-04-04 NOTE — ED Provider Notes (Signed)
[facility] EMERGENCY DEPARTMENT Provider Note   CSN: [id number] Arrival date & time: 04/04/19  1626     History   Chief Complaint Chief Complaint  Patient presents with  . Headache    HPI Edward Montes is a 38 y.o. male presents today for evaluation of acute onset, persistent muffled hearing and mild ear pain to the right ear as well as tinnitus to the right ear for 1 week.  Notes feeling as though the ear is "clogged" or as though there is something in it.  Reports feeling very mild "irritating" headache surrounding the right ear.  No ear drainage, fevers, vision changes, numbness or weakness of the extremities, difficulty breathing or swallowing, shortness of breath, chest pain, abdominal pain, nausea, vomiting, photophobia, photophobia, neck stiffness.  No aggravating or alleviating factors noted.  No recent illness.  He has taken at over-the-counter anti-inflammatories and aspirin without relief of symptoms.     The history is provided by the patient.    History reviewed. No pertinent past medical history.  Patient Active Problem List   Diagnosis Date Noted  . Acute pain of left shoulder 02/24/2018    No past surgical history on file.      Home Medications    Prior to Admission medications   Medication Sig Start Date End Date Taking? Authorizing Provider  acetic acid 2 % otic solution Place 4 drops into the right ear 3 (three) times daily. 04/04/19   Cristy Colmenares A, PA-C  ibuprofen (ADVIL,MOTRIN) 800 MG tablet Take 1 tablet (800 mg total) by mouth every 8 (eight) hours as needed for moderate pain. 01/05/18   Raylene Everts, MD  Ibuprofen-Famotidine 800-26.6 MG TABS Take 1 tablet by mouth 3 (three) times daily. 02/24/18   Rosemarie Ax, MD    Family History Family History  Problem Relation Age of Onset  . Healthy Mother     Social History Social History   Tobacco Use  . Smoking status: Never Smoker  . Smokeless tobacco: Never Used   Substance Use Topics  . Alcohol use: No  . Drug use: No     Allergies   Patient has no known allergies.   Review of Systems Review of Systems  Constitutional: Negative for chills and fever.  HENT: Positive for ear pain and hearing loss. Negative for ear discharge.   Eyes: Negative for photophobia and visual disturbance.  Respiratory: Negative for shortness of breath.   Cardiovascular: Negative for chest pain.  Gastrointestinal: Negative for abdominal pain, nausea and vomiting.  Neurological: Positive for headaches. Negative for dizziness, syncope, weakness, light-headedness and numbness.  All other systems reviewed and are negative.    Physical Exam Updated Vital Signs BP 119/74 (BP Location: Right Arm)   Pulse (!) 58   Temp 98.5 F (36.9 C) (Oral)   Resp 16   Ht 6\' 3"  (1.905 m)   Wt 80.7 kg   SpO2 100%   BMI 22.25 kg/m   Physical Exam Vitals signs and nursing note reviewed.  Constitutional:      General: He is not in acute distress.    Appearance: He is well-developed.  HENT:     Head: Normocephalic and atraumatic.     Comments: No mastoid tenderness bilaterally.  No tenderness to palpation of the tragus or pinna bilaterally with no swelling of the auricles.  Unable to assess TMs due to bilateral cerumen impaction Eyes:     General: No visual field deficit.  Right eye: No discharge.        Left eye: No discharge.     Extraocular Movements: Extraocular movements intact.     Conjunctiva/sclera: Conjunctivae normal.     Pupils: Pupils are equal, round, and reactive to light.  Neck:     Musculoskeletal: Normal range of motion and neck supple. No neck rigidity.     Vascular: No JVD.     Trachea: No tracheal deviation.  Cardiovascular:     Rate and Rhythm: Normal rate and regular rhythm.  Pulmonary:     Effort: Pulmonary effort is normal.     Breath sounds: Normal breath sounds.  Abdominal:     General: Bowel sounds are normal. There is no distension.      Palpations: Abdomen is soft.  Lymphadenopathy:     Cervical: No cervical adenopathy.  Skin:    General: Skin is warm and dry.     Findings: No erythema.  Neurological:     Mental Status: He is alert and oriented to person, place, and time.     GCS: GCS eye subscore is 4. GCS verbal subscore is 5. GCS motor subscore is 6.     Cranial Nerves: No cranial nerve deficit or facial asymmetry.     Sensory: No sensory deficit.     Motor: No weakness.     Coordination: Romberg sign negative. Coordination normal.     Gait: Gait normal.     Comments: Mental Status:  Alert, thought content appropriate, able to give a coherent history. Speech fluent without evidence of aphasia. Able to follow 2 step commands without difficulty.  Cranial Nerves:  II:  Peripheral visual fields grossly normal, pupils equal, round, reactive to light III,IV, VI: ptosis not present, extra-ocular motions intact bilaterally  V,VII: smile symmetric, facial light touch sensation equal VIII: hearing grossly normal to voice  X: uvula elevates symmetrically  XI: bilateral shoulder shrug symmetric and strong XII: midline tongue extension without fassiculations Motor:  Normal tone. 5/5 strength of BUE and BLE major muscle groups including strong and equal grip strength and dorsiflexion/plantar flexion Sensory: light touch normal in all extremities. Cerebellar: normal finger-to-nose with bilateral upper extremities Gait: normal gait and balance. Able to walk on toes and heels with ease.    Psychiatric:        Behavior: Behavior normal.      ED Treatments / Results  Labs (all labs ordered are listed, but only abnormal results are displayed) Labs Reviewed - No data to display  EKG None  Radiology No results found.  Procedures Procedures (including critical care time)  Medications Ordered in ED Medications - No data to display   Initial Impression / Assessment and Plan / ED Course  I have reviewed the triage  vital signs and the nursing notes.  Pertinent labs & imaging results that were available during my care of the patient were reviewed by me and considered in my medical decision making (see chart for details).        Patient presenting for evaluation of right ear hearing loss, tinnitus, mild irritation.  He is afebrile, vital signs are stable.  He is nontoxic in appearance.  No focal neurologic deficits, normal neurologic examination.  Doubt ICH, meningitis, CVA, or other acute serious intracranial abnormality.  Bilateral cerumen impaction noted.  He is requesting debridement of cerumen impaction right.  On evaluation after this he had some mild irritation of the right ear canal so we will prescribe Acetasol drops for mild otitis  externa.  No evidence of tympanic membrane rupture or acute otitis media.  No evidence of malignant otitis externa.  Patient reports symptoms have significantly improved after irrigation of the right ear canal.  Recommend follow-up with ENT or PCP in the future for reevaluation of symptoms.  Discussed strict ED return precautions. Patient verbalized understanding of and agreement with plan and is safe for discharge home at this time.   Final Clinical Impressions(s) / ED Diagnoses   Final diagnoses:  Bilateral impacted cerumen    ED Discharge Orders         Ordered    acetic acid 2 % otic solution  3 times daily     04/04/19 1901           Bennye AlmFawze, Aniayah Alaniz A, PA-C 04/04/19 1905    Charlynne PanderYao, David Hsienta, MD 04/04/19 [phone]

## 2019-04-04 NOTE — Discharge Instructions (Signed)
Start using eardrops to the right ear 3 times daily. You can take 1 to 2 tablets of Tylenol (350mg -1000mg  depending on the dose) every 6 hours as needed for pain.  Do not exceed 4000 mg of Tylenol daily.  If your pain persists you can take a dose of ibuprofen in between doses of Tylenol.  I usually recommend 400 to 600 mg of ibuprofen every 6 hours.  Take this with food to avoid upset stomach issues.  Keep the ears clean.  Do not use Q-tips to clean the ears.  You can ask the pharmacist what over-the-counter remedies they recommend for cleaning ears.  Follow-up with ENT on outpatient basis if symptoms persist.  Return to the emergency department if any concerning signs or symptoms develop such as fevers, severe swelling of the ear, abnormal drainage, severe headaches

## 2020-01-26 IMAGING — DX DG SHOULDER 2+V*L*
3 series · 3 of 3 positions shown · non-contrast
Comparison: None.

CLINICAL DATA: Left shoulder pain anteriorly, acute on chronic. No
reported injury.

EXAM:
LEFT SHOULDER - 2+ VIEW

[shoulder ap]
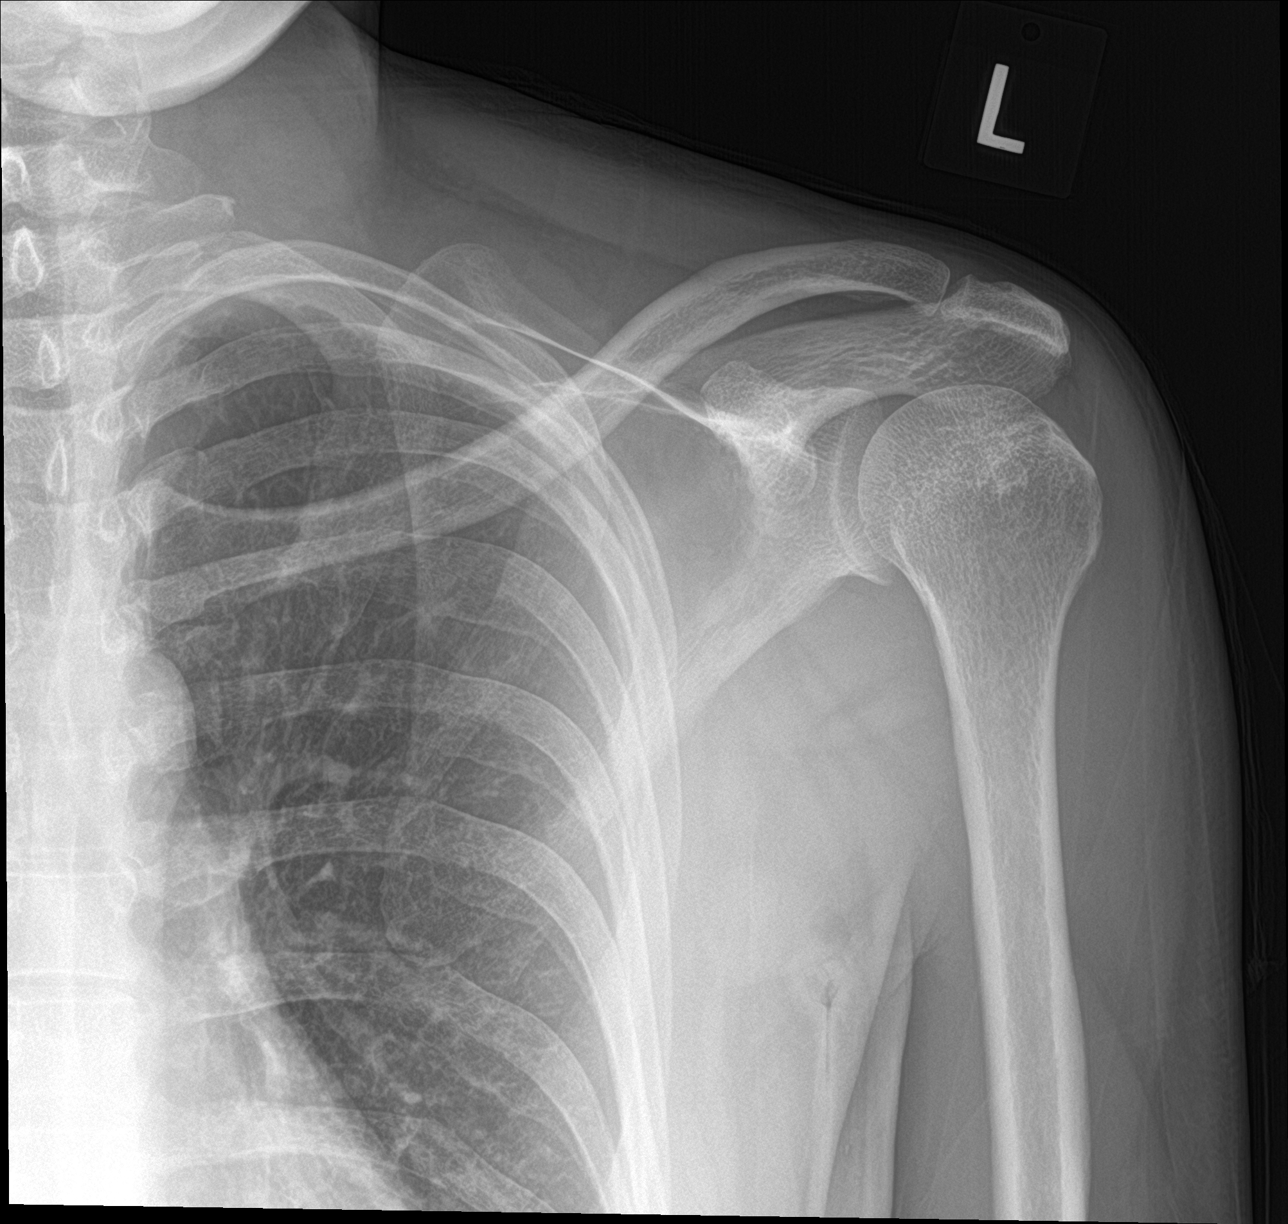

[shoulder grashey]
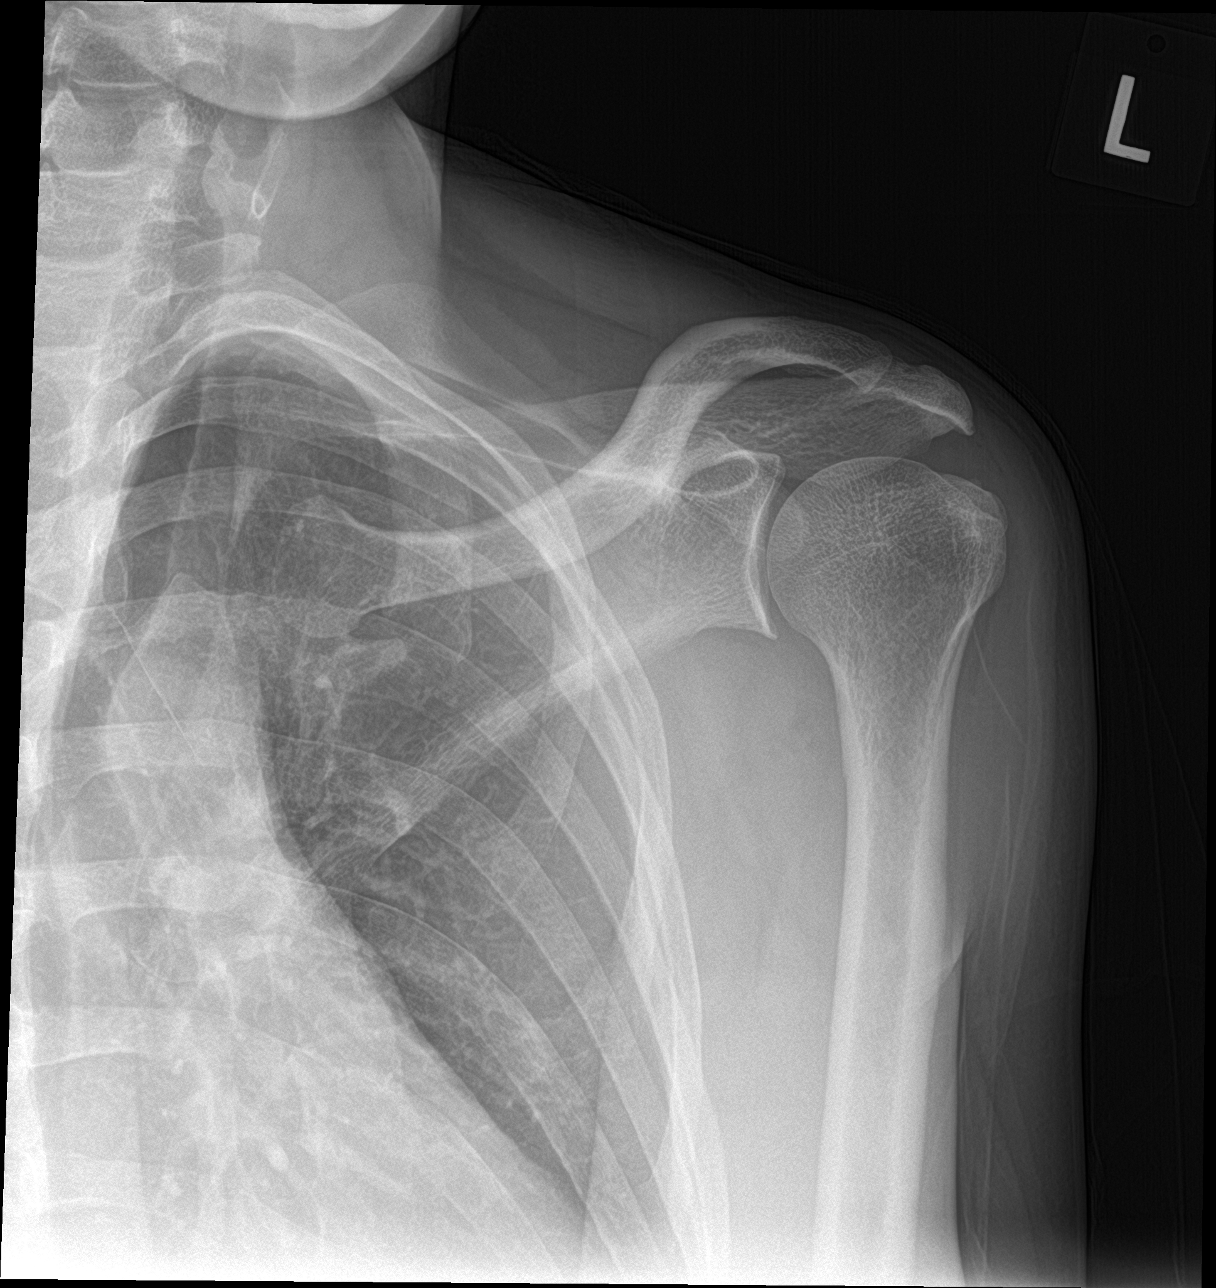

[shoulder y-view]
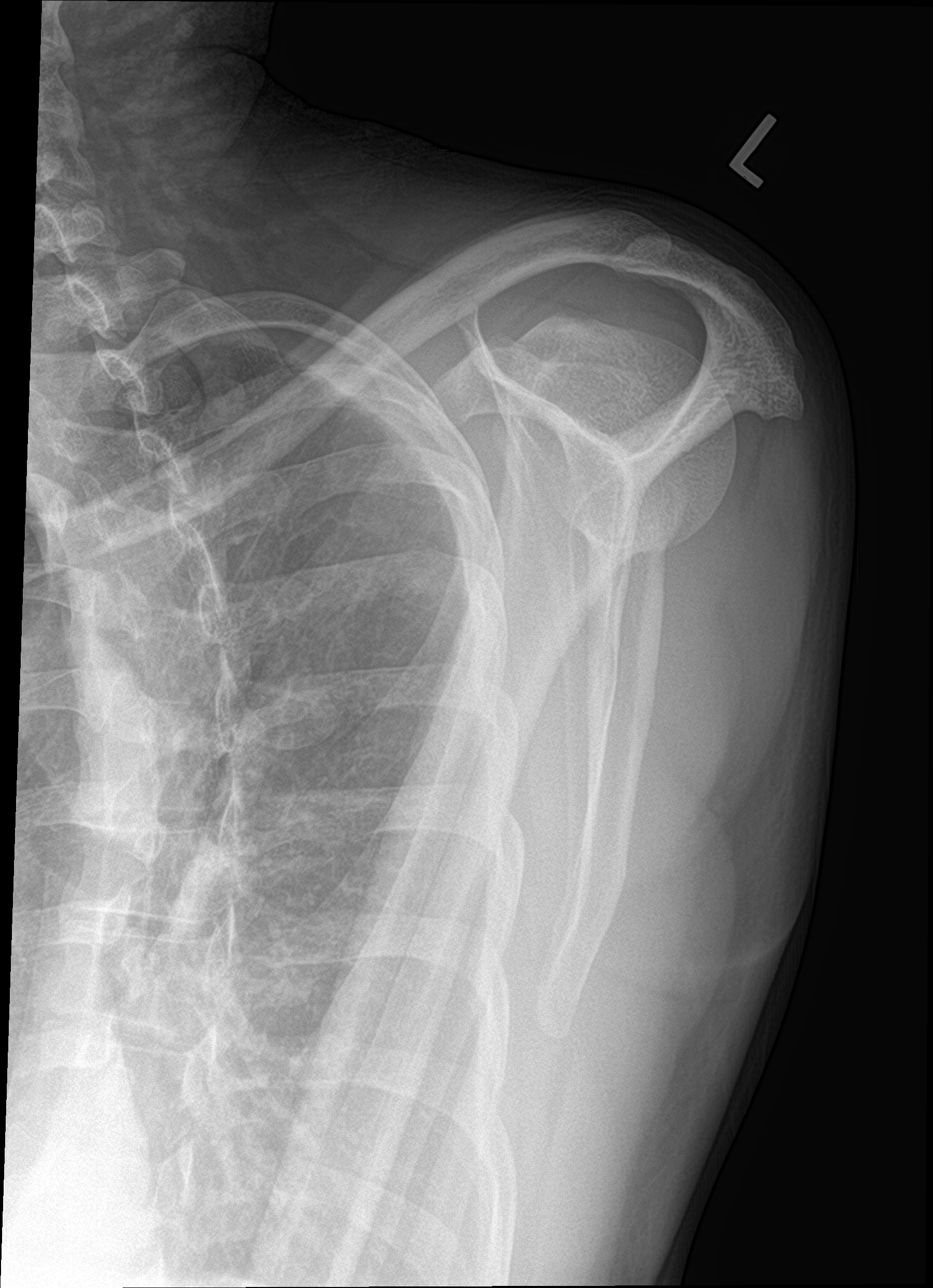

[3 of 3 positions shown; findings below may reference images not displayed]

FINDINGS: There is no evidence of fracture or dislocation. There is no
evidence of arthropathy or other focal bone abnormality. Soft
tissues are unremarkable.
IMPRESSION: Negative.

## 2023-05-23 ENCOUNTER — Ambulatory Visit
Admission: EM | Admit: 2023-05-23 | Discharge: 2023-05-23 | Disposition: A | Discharge status: Home / Self Care | Payer: Commercial Managed Care - HMO | Attending: Family Medicine | Admitting: Family Medicine

## 2023-05-23 ENCOUNTER — Other Ambulatory Visit: Payer: Self-pay

## 2023-05-23 DIAGNOSIS — H6992 Unspecified Eustachian tube disorder, left ear: Secondary | ICD-10-CM | POA: Diagnosis not present

## 2023-05-23 DIAGNOSIS — H5789 Other specified disorders of eye and adnexa: Secondary | ICD-10-CM

## 2023-05-23 NOTE — ED Triage Notes (Signed)
Pt presents with complaints of bilateral ear fullness and ringing in ears x 1 week. Pt currently denies pain. Pt states he attempted to wash his ears out last weekend, removed "a good amount of wax" but still no relief. Pt is also reporting redness and "glossy eyes." Noticed just recently. Pt reports his vision can be foggy at different times, "feels like there is a coating on the outside of eye."

## 2023-05-23 NOTE — Discharge Instructions (Addendum)
Over-the-counter Sudafed and Flonase as directed on the package for nasal congestion and eustachian tube dysfunction.  Take an over-the-counter antihistamine for your allergy such as Claritin, Allegra, Zyrtec as directed on the package. Recommend eye moisture drops such as Systane or refresh for your eye irritation.  If your eye symptoms persist follow-up with an eye doctor

## 2023-05-23 NOTE — ED Provider Notes (Signed)
[facility]    CSN: [id number] Arrival date & time: 05/23/23  1759      History   Chief Complaint Chief Complaint  Patient presents with   Ear Fullness   Eye Problem    HPI Edward Montes is a 43 y.o. male.   The history is provided by the patient.  Ear Fullness Pertinent negatives include no headaches and no shortness of breath.  Eye Problem Associated symptoms: redness   Associated symptoms: no headaches, no itching and no photophobia   Left ear fullness and some ringing in both ears, states he had an issue with wax buildup in the past use an over-the-counter wax removal system on the left with good results but the ear feels full.  Admits decreased hearing.  Admits some nasal congestion which is chronic secondary to his allergies.  States he had eye drainage 1 to 2 months ago, since then has felt he has a film over his eyes at times.  Denies current drainage matting or crusting, change in vision or eye pain.  At times will have eye redness.  States he has allergies all the time uses Afrin as needed.  History reviewed. No pertinent past medical history.  Patient Active Problem List   Diagnosis Date Noted   Acute pain of left shoulder 02/24/2018    History reviewed. No pertinent surgical history.     Home Medications    Prior to Admission medications   Not on File    Family History Family History  Problem Relation Age of Onset   Healthy Mother     Social History Social History   Tobacco Use   Smoking status: Never   Smokeless tobacco: Never  Vaping Use   Vaping status: Never Used  Substance Use Topics   Alcohol use: No   Drug use: No     Allergies   Patient has no known allergies.   Review of Systems Review of Systems  Constitutional:  Negative for fever.  HENT:  Positive for congestion, ear pain (Fullness and ringing), hearing loss and sneezing. Negative for ear discharge, postnasal drip and sinus pain.   Eyes:  Positive for  redness. Negative for photophobia, pain, itching and visual disturbance.  Respiratory:  Negative for cough and shortness of breath.   Neurological:  Negative for dizziness and headaches.     Physical Exam Triage Vital Signs ED Triage Vitals  Encounter Vitals Group     BP 05/23/23 1816 129/84     Systolic BP Percentile --      Diastolic BP Percentile --      Pulse Rate 05/23/23 1816 77     Resp 05/23/23 1816 18     Temp 05/23/23 1816 97.9 F (36.6 C)     Temp Source 05/23/23 1816 Oral     SpO2 05/23/23 1816 96 %     Weight 05/23/23 1818 220 lb (99.8 kg)     Height 05/23/23 1818 6\' 3"  (1.905 m)     Head Circumference --      Peak Flow --      Pain Score 05/23/23 1818 0     Pain Loc --      Pain Education --      Exclude from Growth Chart --    No data found.  Updated Vital Signs BP 129/84 (BP Location: Right Arm)   Pulse 77   Temp 97.9 F (36.6 C) (Oral)   Resp 18   Ht 6\' 3"  (1.905 m)  Wt 220 lb (99.8 kg)   SpO2 96%   BMI 27.50 kg/m   Visual Acuity Right Eye Distance: 20/15 Left Eye Distance: 20/25 Bilateral Distance: 20/20 (pt denies wearing glasses or contacts daily or for visual acuity.)  Right Eye Near:   Left Eye Near:    Bilateral Near:     Physical Exam Vitals and nursing note reviewed.  Constitutional:      Appearance: He is diaphoretic. He is not ill-appearing or toxic-appearing.  HENT:     Head: Normocephalic and atraumatic.     Right Ear: Tympanic membrane normal.     Ears:     Comments: Left serous effusion normal landmarks no redness bulging or retractions    Nose: Congestion (Left side greater than right) present.     Mouth/Throat:     Mouth: Mucous membranes are moist.     Pharynx: Oropharynx is clear. No posterior oropharyngeal erythema.  Eyes:     General: No scleral icterus.       Right eye: No discharge.        Left eye: No discharge.     Extraocular Movements: Extraocular movements intact.     Pupils: Pupils are equal, round, and  reactive to light.     Comments: Mild bilateral  injection  Cardiovascular:     Rate and Rhythm: Normal rate.  Pulmonary:     Effort: Pulmonary effort is normal. No respiratory distress.  Musculoskeletal:     Cervical back: Neck supple.  Lymphadenopathy:     Cervical: No cervical adenopathy.  Skin:    General: Skin is warm.  Neurological:     Mental Status: He is alert.  Psychiatric:        Mood and Affect: Mood normal.      UC Treatments / Results  Labs (all labs ordered are listed, but only abnormal results are displayed) Labs Reviewed - No data to display  EKG   Radiology No results found.  Procedures Procedures (including critical care time)  Medications Ordered in UC Medications - No data to display  Initial Impression / Assessment and Plan / UC Course  I have reviewed the triage vital signs and the nursing notes.  Pertinent labs & imaging results that were available during my care of the patient were reviewed by me and considered in my medical decision making (see chart for details).     43 year old with ear fullness and decreased hearing left ear has serous effusion has nasal congestion reports allergies recommend daily antihistamine, OTC Sudafed and Flonase, should stop the Afrin.  Regarding eye complaint recommend OTC eye moisture drops such as refresh stain if symptoms fail to improve should see an eye doctor Final Clinical Impressions(s) / UC Diagnoses   Final diagnoses:  Eustachian tube dysfunction, left  Eye irritation     Discharge Instructions      Over-the-counter Sudafed and Flonase as directed on the package for nasal congestion and eustachian tube dysfunction.  Take an over-the-counter antihistamine for your allergy such as Claritin, Allegra, Zyrtec as directed on the package. Recommend eye moisture drops such as Systane or refresh for your eye irritation.  If your eye symptoms persist follow-up with an eye doctor   ED Prescriptions    None    PDMP not reviewed this encounter.   Meliton Rattan, [place] 05/23/23 1840

## 2024-01-27 ENCOUNTER — Emergency Department (HOSPITAL_BASED_OUTPATIENT_CLINIC_OR_DEPARTMENT_OTHER): Payer: Self-pay

## 2024-01-27 ENCOUNTER — Emergency Department (HOSPITAL_BASED_OUTPATIENT_CLINIC_OR_DEPARTMENT_OTHER)
Admission: EM | Admit: 2024-01-27 | Discharge: 2024-01-28 | Disposition: A | Discharge status: Home / Self Care | Payer: Self-pay | Attending: Emergency Medicine | Admitting: Emergency Medicine

## 2024-01-27 ENCOUNTER — Encounter (HOSPITAL_BASED_OUTPATIENT_CLINIC_OR_DEPARTMENT_OTHER): Payer: Self-pay

## 2024-01-27 DIAGNOSIS — L03311 Cellulitis of abdominal wall: Secondary | ICD-10-CM | POA: Insufficient documentation

## 2024-01-27 DIAGNOSIS — L02211 Cutaneous abscess of abdominal wall: Secondary | ICD-10-CM | POA: Insufficient documentation

## 2024-01-27 DIAGNOSIS — D649 Anemia, unspecified: Secondary | ICD-10-CM | POA: Insufficient documentation

## 2024-01-27 LAB — URINALYSIS, ROUTINE W REFLEX MICROSCOPIC
Bilirubin Urine: NEGATIVE
Glucose, UA: NEGATIVE mg/dL
Ketones, ur: NEGATIVE mg/dL
Leukocytes,Ua: NEGATIVE
Nitrite: NEGATIVE
Protein, ur: NEGATIVE mg/dL
Specific Gravity, Urine: >=1.03 (ref 1.005–1.030)
pH: 5.5 (ref 5.0–8.0)

## 2024-01-27 LAB — CBC WITH DIFFERENTIAL/PLATELET
Abs Immature Granulocytes: 0.02 K/uL (ref 0.00–0.07)
Basophils Absolute: 0 K/uL (ref 0.0–0.1)
Basophils Relative: 0 %
Eosinophils Absolute: 0.2 K/uL (ref 0.0–0.5)
Eosinophils Relative: 2 %
HCT: 38.8 % — ABNORMAL LOW (ref 39.0–52.0)
Hemoglobin: 12.6 g/dL — ABNORMAL LOW (ref 13.0–17.0)
Immature Granulocytes: 0 %
Lymphocytes Relative: 36 %
Lymphs Abs: 2.7 K/uL (ref 0.7–4.0)
MCH: 27.3 pg (ref 26.0–34.0)
MCHC: 32.5 g/dL (ref 30.0–36.0)
MCV: 84.2 fL (ref 80.0–100.0)
Monocytes Absolute: 0.6 K/uL (ref 0.1–1.0)
Monocytes Relative: 8 %
Neutro Abs: 3.9 K/uL (ref 1.7–7.7)
Neutrophils Relative %: 54 %
Platelets: 326 K/uL (ref 150–400)
RBC: 4.61 MIL/uL (ref 4.22–5.81)
RDW: 14.1 % (ref 11.5–15.5)
WBC: 7.4 K/uL (ref 4.0–10.5)
nRBC: 0 % (ref 0.0–0.2)

## 2024-01-27 LAB — COMPREHENSIVE METABOLIC PANEL WITH GFR
ALT: 28 U/L (ref 0–44)
AST: 31 U/L (ref 15–41)
Albumin: 4.5 g/dL (ref 3.5–5.0)
Alkaline Phosphatase: 125 U/L (ref 38–126)
Anion gap: 12 (ref 5–15)
BUN: 12 mg/dL (ref 6–20)
CO2: 24 mmol/L (ref 22–32)
Calcium: 8.9 mg/dL (ref 8.9–10.3)
Chloride: 105 mmol/L (ref 98–111)
Creatinine, Ser: 1.08 mg/dL (ref 0.61–1.24)
GFR, Estimated: >60 mL/min (ref >60)
Glucose, Bld: 89 mg/dL (ref 70–99)
Potassium: 3.7 mmol/L (ref 3.5–5.1)
Sodium: 141 mmol/L (ref 135–145)
Total Bilirubin: 0.3 mg/dL (ref 0.0–1.2)
Total Protein: 7.8 g/dL (ref 6.5–8.1)

## 2024-01-27 LAB — URINALYSIS, MICROSCOPIC (REFLEX)

## 2024-01-27 MED ORDER — IOHEXOL 300 MG/ML  SOLN
100.0000 mL | Freq: Once | INTRAMUSCULAR | Status: AC | PRN
  Administered 2024-01-27: 100 mL via INTRAVENOUS

## 2024-01-27 NOTE — ED Triage Notes (Signed)
 Pt states he has abdominal pain x 2 weeks ?hernia felt below umbilicus  -N/V/D  Bowel movement have been normal

## 2024-01-27 NOTE — ED Provider Notes (Signed)
 [place] [facility]GENCY DEPARTMENT AT [facility] Provider Note   CSN: [id number] Arrival date & time: 01/27/24  8078     Patient presents with: Abdominal Pain   Edward Montes is a 43 y.o. male.   Patient presents to the emergency department today for evaluation of pain and swelling of the lower abdomen, midline below the umbilicus.  Patient first noted some swelling a couple of weeks ago.  The area seems to have become larger in size.  He was concerned about a hernia or abscess.  No drainage from the area.  No overlying redness.  The area is tender to palpation.  No fevers.  No vomiting or diarrhea.  No urinary symptoms.  No history of similar symptoms.  No history of abdominal surgeries.       Prior to Admission medications   Not on File    Allergies: Patient has no known allergies.    Review of Systems  Updated Vital Signs BP 113/74   Pulse 64   Temp 97.6 F (36.4 C) (Temporal)   Resp 18   Ht 6' 2 (1.88 m)   Wt 96.2 kg   SpO2 100%   BMI 27.22 kg/m   Physical Exam Vitals and nursing note reviewed.  Constitutional:      General: He is not in acute distress.    Appearance: He is well-developed.  HENT:     Head: Normocephalic and atraumatic.  Eyes:     General:        Right eye: No discharge.        Left eye: No discharge.     Conjunctiva/sclera: Conjunctivae normal.  Cardiovascular:     Rate and Rhythm: Normal rate and regular rhythm.     Heart sounds: Normal heart sounds.  Pulmonary:     Effort: Pulmonary effort is normal.     Breath sounds: Normal breath sounds.  Abdominal:     Palpations: Abdomen is soft.     Tenderness: There is abdominal tenderness.      Comments: Patient with a nodular area, approximately 2 to 3 cm, infraumbilical.  This feels deep and it is hard to discern exactly what this area is.  No overlying erythema or signs typical of abscess.  Does not feel typical of hernia, but difficult to be sure.  Area is mobile, not  reducible.  Musculoskeletal:     Cervical back: Normal range of motion and neck supple.  Skin:    General: Skin is warm and dry.  Neurological:     Mental Status: He is alert.     (all labs ordered are listed, but only abnormal results are displayed) Labs Reviewed  CBC WITH DIFFERENTIAL/PLATELET - Abnormal; Notable for the following components:      Result Value   Hemoglobin 12.6 (*)    HCT 38.8 (*)    All other components within normal limits  URINALYSIS, ROUTINE W REFLEX MICROSCOPIC - Abnormal; Notable for the following components:   Hgb urine dipstick TRACE (*)    All other components within normal limits  URINALYSIS, MICROSCOPIC (REFLEX) - Abnormal; Notable for the following components:   Bacteria, UA RARE (*)    All other components within normal limits  COMPREHENSIVE METABOLIC PANEL WITH GFR    EKG: None  Radiology: No results found.   .Incision and Drainage  Date/Time: 01/28/2024 12:21 AM  Performed by: Desiderio Chew, PA-C Authorized by: Desiderio Chew, PA-C   Consent:    Consent obtained:  Verbal  Consent given by:  Patient   Risks discussed:  Bleeding, incomplete drainage, damage to other organs, pain and infection   Alternatives discussed:  No treatment Universal protocol:    Patient identity confirmed:  Verbally with patient and provided demographic data Location:    Type:  Abscess   Size:  2cm   Location:  Trunk   Trunk location:  Abdomen Pre-procedure details:    Skin preparation:  Povidone-iodine Anesthesia:    Anesthesia method:  Local infiltration   Local anesthetic:  Lidocaine 2% WITH epi Procedure type:    Complexity:  Simple Procedure details:    Incision types:  Stab incision   Wound management:  Probed and deloculated   Drainage:  Purulent   Drainage amount:  Moderate   Wound treatment:  Wound left open Post-procedure details:    Procedure completion:  Tolerated well, no immediate complications    Medications Ordered in the ED   lidocaine-EPINEPHrine (XYLOCAINE W/EPI) 2 %-1:200000 (PF) injection 10 mL (has no administration in time range)  doxycycline (VIBRA-TABS) tablet 100 mg (has no administration in time range)  iohexol (OMNIPAQUE) 300 MG/ML solution 100 mL (100 mLs Intravenous Contrast Given 01/27/24 2342)    ED Course  Patient seen and examined. History obtained directly from patient. Work-up including labs, imaging, EKG ordered in triage, if performed, were reviewed.    Labs/EKG: Independently reviewed and interpreted.  This included: CBC shows normal Helinski blood cell count, mild anemia (patient made aware, encouraged monitoring over time, denies active bleeding); CMP unremarkable; UA unremarkable.  Imaging: Discussed obtaining abdominal imaging to further evaluate and guide treatment, hernia versus soft tissue infection, cyst or abscess.  Patient would like to proceed.  Medications/Fluids: None ordered  Most recent vital signs reviewed and are as follows: BP 113/74   Pulse 64   Temp 97.6 F (36.4 C) (Temporal)   Resp 18   Ht 6' 2 (1.88 m)   Wt 96.2 kg   SpO2 100%   BMI 27.22 kg/m   Initial impression: Lower abdominal pain, soft tissue nodule versus ventral hernia.  12:18 AM Reassessment performed. Patient appears stable.  I did use bedside ultrasound which showed what appeared to be more of a fluid collection then on CT scan.  After discussion of incision and drainage with patient, we decided to proceed.  I was able to make a small incision and express some purulent material.  Patient will be started on doxycycline.  Imaging personally visualized and interpreted including: CT abdomen pelvis shows cellulitis and developing phlegmon.  Bedside ultrasound suggested at least some fluid collection, purulent drainage was able to be expressed after I&D.  Reviewed pertinent lab work and imaging with patient at bedside. Questions answered.   Most current vital signs reviewed and are as follows: BP 119/86    Pulse (!) 49   Temp 97.7 F (36.5 C) (Oral)   Resp 18   Ht 6' 2 (1.88 m)   Wt 96.2 kg   SpO2 100%   BMI 27.22 kg/m   Plan: Discharge to home.   Prescriptions written for: Doxycycline.  Offered pain medication, patient is okay with OTC meds.  The patient was urged to return to the Emergency Department urgently with worsening pain, swelling, expanding erythema especially if it streaks away from the affected area, fever, or if they have any other concerns.   The patient was urged to return to the Emergency Department or go to their PCP in 48 hours for wound recheck if the area  is not significantly improved.  The patient verbalized understanding and stated agreement with this plan.                                   Medical Decision Making Amount and/or Complexity of Data Reviewed Labs: ordered. Radiology: ordered.  Risk Prescription drug management.   Patient with lower abdominal wall pain and swelling, no significant overlying cellulitis.  Unclear if this represented soft tissue infection versus other abnormality.  CT confirmed no hernia or communicating or complex abscess.  CT actually showed developing phlegmon without significant fluid collection, however fluid collection was noted on bedside ultrasound.  Local incision and drainage was made with moderate amount of purulent fluid drained.  Patient tolerated well.  Remainder of lab work is reassuring.  Will start on Doxy and continue conservative treatments.  The patient's vital signs, pertinent lab work and imaging were reviewed and interpreted as discussed in the ED course. Hospitalization was considered for further testing, treatments, or serial exams/observation. However as patient is well-appearing, has a stable exam, and reassuring studies today, I do not feel that they warrant admission at this time. This plan was discussed with the patient who verbalizes agreement and comfort with this plan and seems reliable and able to  return to the Emergency Department with worsening or changing symptoms.        Final diagnoses:  Abdominal wall abscess  Cellulitis of abdominal wall    ED Discharge Orders          Ordered    doxycycline (VIBRAMYCIN) 100 MG capsule  2 times daily        01/28/24 0017               Desiderio Chew, PA-C 01/28/24 0023    Yolande Lamar BROCKS, MD 02/01/24 [phone]

## 2024-01-28 MED ORDER — DOXYCYCLINE HYCLATE 100 MG PO CAPS: 100.0000 mg | ORAL_CAPSULE | Freq: Two times a day (BID) | ORAL | 0 refills | Status: AC

## 2024-01-28 MED ORDER — LIDOCAINE-EPINEPHRINE (PF) 2 %-1:200000 IJ SOLN
10.0000 mL | Freq: Once | INTRAMUSCULAR | Status: AC
  Administered 2024-01-28: 10 mL
  Filled 2024-01-28: qty 20

## 2024-01-28 MED ORDER — DOXYCYCLINE HYCLATE 100 MG PO TABS
100.0000 mg | ORAL_TABLET | Freq: Once | ORAL | Status: AC
  Administered 2024-01-28: 100 mg via ORAL
  Filled 2024-01-28: qty 1

## 2024-01-28 NOTE — Discharge Instructions (Addendum)
 Please read and follow all provided instructions.  Your diagnoses today include:  1. Abdominal wall abscess   2. Cellulitis of abdominal wall     Tests performed today include: Vital signs. See below for your results today.  Complete blood cell count: Normal [place] blood cell count, mild anemia Basic metabolic panel: No concerning findings CT scan of the abdomen and pelvis shows abdominal wall infection, possible developing abscess  Medications prescribed:  Doxycycline - antibiotic  You have been prescribed an antibiotic medicine: take the entire course of medicine even if you are feeling better. Stopping early can cause the antibiotic not to work.  Take any prescribed medications only as directed.   Home care instructions:  Follow any educational materials contained in this packet  Follow-up instructions: Return to the Emergency Department in 48 hours for a recheck if your symptoms are not significantly improved.  Return instructions:  Return to the Emergency Department if you have: Fever Worsening symptoms Worsening pain Worsening swelling Redness of the skin that moves away from the affected area, especially if it streaks away from the affected area  Any other emergent concerns  Your vital signs today were: BP 119/86   Pulse (!) 49   Temp 97.7 F (36.5 C) (Oral)   Resp 18   Ht 6' 2 (1.88 m)   Wt 96.2 kg   SpO2 100%   BMI 27.22 kg/m  If your blood pressure (BP) was elevated above 135/85 this visit, please have this repeated by your doctor within one month. --------------

## 2024-01-28 NOTE — ED Notes (Signed)
 LIDO pulled and handed to EDP for use.

## 2024-03-31 ENCOUNTER — Encounter (HOSPITAL_COMMUNITY): Payer: Self-pay

## 2024-03-31 ENCOUNTER — Other Ambulatory Visit: Payer: Self-pay

## 2024-03-31 ENCOUNTER — Emergency Department (HOSPITAL_COMMUNITY)
Admission: EM | Admit: 2024-03-31 | Discharge: 2024-03-31 | Disposition: A | Discharge status: Home / Self Care | Payer: Self-pay

## 2024-03-31 ENCOUNTER — Emergency Department (HOSPITAL_COMMUNITY): Payer: Self-pay

## 2024-03-31 DIAGNOSIS — J101 Influenza due to other identified influenza virus with other respiratory manifestations: Secondary | ICD-10-CM | POA: Insufficient documentation

## 2024-03-31 LAB — CBG MONITORING, ED: Glucose-Capillary: 116 mg/dL — ABNORMAL HIGH (ref 70–99)

## 2024-03-31 LAB — CBC
HCT: 44.6 % (ref 39.0–52.0)
Hemoglobin: 14.8 g/dL (ref 13.0–17.0)
MCH: 27.3 pg (ref 26.0–34.0)
MCHC: 33.2 g/dL (ref 30.0–36.0)
MCV: 82.1 fL (ref 80.0–100.0)
Platelets: 328 K/uL (ref 150–400)
RBC: 5.43 MIL/uL (ref 4.22–5.81)
RDW: 13.5 % (ref 11.5–15.5)
WBC: 3.9 K/uL — ABNORMAL LOW (ref 4.0–10.5)
nRBC: 0 % (ref 0.0–0.2)

## 2024-03-31 LAB — COMPREHENSIVE METABOLIC PANEL WITH GFR
ALT: 60 U/L — ABNORMAL HIGH (ref 0–44)
AST: 64 U/L — ABNORMAL HIGH (ref 15–41)
Albumin: 4.4 g/dL (ref 3.5–5.0)
Alkaline Phosphatase: 104 U/L (ref 38–126)
Anion gap: 13 (ref 5–15)
BUN: 13 mg/dL (ref 6–20)
CO2: 26 mmol/L (ref 22–32)
Calcium: 9.5 mg/dL (ref 8.9–10.3)
Chloride: 99 mmol/L (ref 98–111)
Creatinine, Ser: 1.14 mg/dL (ref 0.61–1.24)
GFR, Estimated: >60 mL/min (ref >60)
Glucose, Bld: 108 mg/dL — ABNORMAL HIGH (ref 70–99)
Potassium: 3.3 mmol/L — ABNORMAL LOW (ref 3.5–5.1)
Sodium: 139 mmol/L (ref 135–145)
Total Bilirubin: 0.4 mg/dL (ref 0.0–1.2)
Total Protein: 8.3 g/dL — ABNORMAL HIGH (ref 6.5–8.1)

## 2024-03-31 LAB — TROPONIN T, HIGH SENSITIVITY: Troponin T High Sensitivity: <15 ng/L (ref 0–19)

## 2024-03-31 LAB — URINALYSIS, ROUTINE W REFLEX MICROSCOPIC
Bacteria, UA: NONE SEEN
Bilirubin Urine: NEGATIVE
Glucose, UA: NEGATIVE mg/dL
Hgb urine dipstick: NEGATIVE
Ketones, ur: NEGATIVE mg/dL
Leukocytes,Ua: NEGATIVE
Nitrite: NEGATIVE
Protein, ur: 30 mg/dL — AB
Specific Gravity, Urine: 1.026 (ref 1.005–1.030)
pH: 5 (ref 5.0–8.0)

## 2024-03-31 LAB — RESP PANEL BY RT-PCR (RSV, FLU A&B, COVID)  RVPGX2
Influenza A by PCR: POSITIVE — AB
Influenza B by PCR: NEGATIVE
Resp Syncytial Virus by PCR: NEGATIVE
SARS Coronavirus 2 by RT PCR: NEGATIVE

## 2024-03-31 LAB — GROUP A STREP BY PCR: Group A Strep by PCR: NOT DETECTED

## 2024-03-31 MED ORDER — ALUM & MAG HYDROXIDE-SIMETH 200-200-20 MG/5ML PO SUSP
30.0000 mL | Freq: Once | ORAL | Status: AC
  Administered 2024-03-31: 30 mL via ORAL
  Filled 2024-03-31: qty 30

## 2024-03-31 MED ORDER — PSEUDOEPH-BROMPHEN-DM 30-2-10 MG/5ML PO SYRP: 5.0000 mL | ORAL_SOLUTION | Freq: Four times a day (QID) | ORAL | 0 refills | Status: AC | PRN

## 2024-03-31 MED ORDER — FAMOTIDINE 20 MG PO TABS: 20.0000 mg | ORAL_TABLET | Freq: Two times a day (BID) | ORAL | 0 refills | Status: AC

## 2024-03-31 NOTE — ED Triage Notes (Addendum)
 Pt c/o weakness & generalized sickness since Friday. Loss of appetite, reports productive cough, night sweats, diarrhea & SOB. Denies N&V.

## 2024-03-31 NOTE — ED Provider Notes (Signed)
 [place] EMERGENCY DEPARTMENT AT [facility] Provider Note   CSN: [id number] Arrival date & time: 03/31/24  1404     Patient presents with: Cough, Weakness, Night Sweats, and Shortness of Breath   Edward Montes is a 43 y.o. male.   43 year old male with no reported past medical history presenting to the emergency department today with cough, generalized weakness, night sweats, and dyspnea.  Patient has been having symptoms now since Friday.  Ports he has had a nonproductive cough.  Also reports some epigastric discomfort with swallowing.  He came to the ER for further evaluation regarding this due to ongoing symptoms.  He denies being around any known sick contacts he is aware of.  Reports he is just feeling generally weak.  Denies any focal weakness, numbness, or tingling.  Came to the ER today further evaluation.   Cough Associated symptoms: shortness of breath   Weakness Associated symptoms: cough and shortness of breath   Shortness of Breath Associated symptoms: cough        Prior to Admission medications   Medication Sig Start Date End Date Taking? Authorizing Provider  brompheniramine-pseudoephedrine-DM 30-2-10 MG/5ML syrup Take 5 mLs by mouth 4 (four) times daily as needed (Cough). 03/31/24  Yes Ula Prentice SAUNDERS, MD  famotidine  (PEPCID ) 20 MG tablet Take 1 tablet (20 mg total) by mouth 2 (two) times daily. 03/31/24  Yes Ula Prentice SAUNDERS, MD  doxycycline  (VIBRAMYCIN ) 100 MG capsule Take 1 capsule (100 mg total) by mouth 2 (two) times daily. 01/28/24   Geiple, Joshua, PA-C    Allergies: Patient has no known allergies.    Review of Systems  Respiratory:  Positive for cough and shortness of breath.   Neurological:  Positive for weakness.  All other systems reviewed and are negative.   Updated Vital Signs BP (!) 127/91   Pulse 100   Temp 99.6 F (37.6 C) (Oral)   Resp 19   Ht 6' 2 (1.88 m)   Wt 96.2 kg   SpO2 100%   BMI 27.23 kg/m   Physical Exam Vitals  and nursing note reviewed.   Gen: NAD Eyes: PERRL, EOMI HEENT: no oropharyngeal swelling Neck: trachea midline Resp: clear to auscultation bilaterally Card: RRR, no murmurs, rubs, or gallops Abd: nontender, nondistended Extremities: no calf tenderness, no edema Vascular: 2+ radial pulses bilaterally, 2+ DP pulses bilaterally Skin: no rashes Psyc: acting appropriately   (all labs ordered are listed, but only abnormal results are displayed) Labs Reviewed  RESP PANEL BY RT-PCR (RSV, FLU A&B, COVID)  RVPGX2 - Abnormal; Notable for the following components:      Result Value   Influenza A by PCR POSITIVE (*)    All other components within normal limits  COMPREHENSIVE METABOLIC PANEL WITH GFR - Abnormal; Notable for the following components:   Potassium 3.3 (*)    Glucose, Bld 108 (*)    Total Protein 8.3 (*)    AST 64 (*)    ALT 60 (*)    All other components within normal limits  CBC - Abnormal; Notable for the following components:   WBC 3.9 (*)    All other components within normal limits  CBG MONITORING, ED - Abnormal; Notable for the following components:   Glucose-Capillary 116 (*)    All other components within normal limits  GROUP A STREP BY PCR  URINALYSIS, ROUTINE W REFLEX MICROSCOPIC  TROPONIN T, HIGH SENSITIVITY    EKG: EKG Interpretation Date/Time:  Wednesday March 31 2024  14:36:08 EST Ventricular Rate:  111 PR Interval:  179 QRS Duration:  94 QT Interval:  318 QTC Calculation: 433 R Axis:   56  Text Interpretation: Sinus tachycardia ST elevation, consider inferior injury Confirmed by Ula Barter [id number]) on 03/31/2024 4:47:14 PM  Radiology: [facility] Chest Portable 1 View Result Date: 03/31/2024 CLINICAL DATA:  Shortness of breath. EXAM: PORTABLE CHEST 1 VIEW COMPARISON:  May 22, 2012 FINDINGS: The heart size and mediastinal contours are within normal limits. Both lungs are clear. The visualized skeletal structures are unremarkable. IMPRESSION: No active  disease. Electronically Signed   By: Suzen Dials M.D.   On: 03/31/2024 18:39     Procedures   Medications Ordered in the ED  alum & mag hydroxide-simeth (MAALOX/MYLANTA) 200-200-20 MG/5ML suspension 30 mL (30 mLs Oral Given 03/31/24 1751)                                    Medical Decision Making 43 year old male with no reported past medical history presenting to the emergency department today with cough and shortness of breath.  He is denying any significant chest pain.  The patient's initial labs in triage are revealing for influenza A.  EKG shows some nonspecific EKG changes and some tachycardia.  Will obtain a troponin to eval for myocarditis given the shortness of breath as well as a chest x-ray.  This is unremarkable and the patient may be stable for discharge with symptomatic treatment.  The patient's work appears reassuring.  He did have a urinalysis drawn but is not having any urinary symptoms and did not want to stay to wait for these results and is requesting discharge.  He is discharged with return precautions.  Amount and/or Complexity of Data Reviewed Labs: ordered. Radiology: ordered.  Risk OTC drugs. Prescription drug management.        Final diagnoses:  Influenza A    ED Discharge Orders          Ordered    famotidine  (PEPCID ) 20 MG tablet  2 times daily        03/31/24 1840    brompheniramine-pseudoephedrine-DM 30-2-10 MG/5ML syrup  4 times daily PRN        03/31/24 1840               Ula Barter SAUNDERS, MD 03/31/24 1918

## 2024-03-31 NOTE — Discharge Instructions (Signed)
 Your workup today was reassuring.  Please take the medications as needed for your symptoms.  Follow-up with your doctor.  Return to the ER for worsening symptoms.

## 2024-06-11 ENCOUNTER — Ambulatory Visit
